# Patient Record
Sex: Male | Born: 1950
Health system: Southern US, Community
[De-identification: ages and names within clinical notes are randomized; demographics above are authoritative.]

## PROBLEM LIST (undated history)

## (undated) DIAGNOSIS — R0602 Shortness of breath: Secondary | ICD-10-CM

## (undated) DIAGNOSIS — G4730 Sleep apnea, unspecified: Secondary | ICD-10-CM

## (undated) DIAGNOSIS — I499 Cardiac arrhythmia, unspecified: Secondary | ICD-10-CM

## (undated) DIAGNOSIS — I4892 Unspecified atrial flutter: Secondary | ICD-10-CM

## (undated) DIAGNOSIS — I5021 Acute systolic (congestive) heart failure: Secondary | ICD-10-CM

## (undated) DIAGNOSIS — N529 Male erectile dysfunction, unspecified: Secondary | ICD-10-CM

## (undated) DIAGNOSIS — M1990 Unspecified osteoarthritis, unspecified site: Secondary | ICD-10-CM

## (undated) DIAGNOSIS — E785 Hyperlipidemia, unspecified: Secondary | ICD-10-CM

## (undated) DIAGNOSIS — I429 Cardiomyopathy, unspecified: Secondary | ICD-10-CM

## (undated) DIAGNOSIS — G4733 Obstructive sleep apnea (adult) (pediatric): Secondary | ICD-10-CM

## (undated) DIAGNOSIS — I5022 Chronic systolic (congestive) heart failure: Secondary | ICD-10-CM

## (undated) DIAGNOSIS — E669 Obesity, unspecified: Secondary | ICD-10-CM

## (undated) HISTORY — DX: Obstructive sleep apnea (adult) (pediatric): G47.33

## (undated) HISTORY — DX: Cardiomyopathy, unspecified: I42.9

## (undated) HISTORY — PX: APPENDECTOMY: SHX54

## (undated) HISTORY — DX: Male erectile dysfunction, unspecified: N52.9

## (undated) HISTORY — DX: Chronic systolic (congestive) heart failure: I50.22

## (undated) HISTORY — DX: Acute systolic (congestive) heart failure: I50.21

## (undated) HISTORY — DX: Obesity, unspecified: E66.9

## (undated) HISTORY — DX: Hyperlipidemia, unspecified: E78.5

## (undated) HISTORY — DX: Unspecified atrial flutter: I48.92

## (undated) HISTORY — DX: Unspecified osteoarthritis, unspecified site: M19.90

---

## 2003-10-26 ENCOUNTER — Emergency Department (HOSPITAL_COMMUNITY): Admission: EM | Admit: 2003-10-26 | Discharge: 2003-10-26 | Payer: Self-pay | Admitting: Emergency Medicine

## 2010-12-25 HISTORY — PX: TOTAL HIP ARTHROPLASTY: SHX124

## 2011-02-20 NOTE — H&P (Signed)
Jerome Kent, HERBST           ACCOUNT NO.:  [id number]  MEDICAL RECORD NO.:  [id number]           PATIENT TYPE:  I  LOCATION:  1S                           FACILITY:  [facility]  PHYSICIAN:  Madlyn Frankel. Charlann Boxer, M.D.  DATE OF BIRTH:  Nov 04, 1951  DATE OF ADMISSION: DATE OF DISCHARGE:                             HISTORY & PHYSICAL   ADMITTING DIAGNOSIS:  Right hip osteoarthritis.  BRIEF HISTORY:  Jerome Kent is a very pleasant 60 year old and otherwise healthy male who has had progressive problems and degenerative changes to his right hip.  Radiographs revealed advanced degenerative changes.  He had failed conservative measures and wished to proceed with hip arthroplasty.  Risks of infection, DVT, component failure, dislocation were all discussed, reviewed as well as an anterior versus posterior approach.  He is at this point scheduled for same-day surgery on February 28, 2011, for an anterior right total hip replacement.  PAST MEDICAL HISTORY: 1. Sleep apnea. 2. History of hypercholesterolemia. 3. He reports some mild coronary event in the 1980s with recent     cardiac catheterizations without evidence of any coronary artery     disease.  PAST SURGICAL HISTORY:  Abdominal surgery due to trauma and history of appendectomy.  CURRENT MEDICATIONS:  Generic Lipitor or atorvastatin 40 mg p.o. daily.  DRUG ALLERGIES:  None.  He denies latex, metal, or food allergies.  PRIMARY CARE PHYSICIAN:  Dario Guardian, M.D.  FAMILY MEDICAL HISTORY: 1. Diabetes. 2. Cancer. 3. Coronary artery disease.  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  He is married.  He has a few drinks a week, no significant alcohol or tobacco use.  REVIEW OF SYSTEMS:  Otherwise noted for musculoskeletal concerns with some fatigue due to his right hip pain.  He does have some shortness of breath with exertion with some wheezing.  He has at times some urination at night.  Otherwise, he has had no recent chest  pains, productive cough, or hemoptysis.  No recent rashes.  He has had no headaches, dizziness, blurred vision, or slurred speech.  He does use contacts.  He has had no problems with his balance.  He has had no nausea, vomiting, constipation, or diarrhea.  His appetite remains normal.  He has had no unilateral weakness or numbness or vascular status changes.  PHYSICAL EXAMINATION:  NEUROVASCULAR:  Examination at the time of his index evaluation revealed Trendelenburg gait to his right hip.  Hip range of motion was painful and limited.  He otherwise is neurovascularly intact in the right lower extremity. GENERAL:  Exam was otherwise unremarkable with no blurred vision or slurred speech. NECK:  He had normal cervical range of motion. CHEST:  Clear to auscultation bilaterally. HEART:  Regular rate and rhythm. ABDOMEN:  Slightly obese, but was nontender. VITAL SIGNS:  Obtained in the office were stable with him being afebrile with normal blood pressure.  CURRENT MEDICATIONS: 1. Atorvastatin 40 mg p.o. daily and otherwise, vitamins and minerals     which will be held during the time of an operation, but then will     be resumed postoperatively.  These include: 2. Alli 60  mg t.i.d. 3. Aspirin 81 mg p.o. daily. 4. Vitamin B12 250 mcg daily. 5. Vitamin B6 100 mg daily. 6. CoQ10 100 mg 4 times a week. 7. Vitamin E 400 units daily. 8. Folic acid. 9. Fish oil 600 mcg/137 mcg. 10.Grape Seed and Resveratrol. 11.Magnesium. 12.Mature multivitamin.  ASSESSMENT:  Right hip osteoarthritis.  PLAN:  The patient will be admitted for a same-day surgery on February 28, 2011, for right total hip replacement.  Postoperatively, his plan is to go home.  He will be placed on Xarelto for postoperative anticoagulation for a total of 12 days and then resume aspirin for 4 weeks and then his baby aspirin following this.  We are also going to utilize tranexamic acid and order provided to short stay for this.   He will receive this based on his kilogram weight at the time of induction.  The risks and benefits of this were discussed and reviewed with him.     Madlyn Frankel Charlann Boxer, M.D.     MDO/MEDQ  D:  02/19/2011  T:  02/19/2011  Job:  [id number]  cc:   Dario Guardian, M.D. Fax: [phone]  Electronically Signed by Durene Romans M.D. on 02/20/2011 06:59:08 AM

## 2011-02-21 ENCOUNTER — Other Ambulatory Visit: Payer: Self-pay | Admitting: Orthopedic Surgery

## 2011-02-21 ENCOUNTER — Ambulatory Visit (HOSPITAL_COMMUNITY)
Admission: RE | Admit: 2011-02-21 | Discharge: 2011-02-21 | Disposition: A | Discharge status: Home / Self Care | Payer: BC Managed Care – PPO | Source: Ambulatory Visit | Attending: Orthopedic Surgery | Admitting: Orthopedic Surgery

## 2011-02-21 ENCOUNTER — Other Ambulatory Visit (HOSPITAL_COMMUNITY): Payer: Self-pay | Admitting: Orthopedic Surgery

## 2011-02-21 ENCOUNTER — Encounter (HOSPITAL_COMMUNITY): Payer: BC Managed Care – PPO

## 2011-02-21 DIAGNOSIS — M169 Osteoarthritis of hip, unspecified: Secondary | ICD-10-CM | POA: Insufficient documentation

## 2011-02-21 DIAGNOSIS — Z01811 Encounter for preprocedural respiratory examination: Secondary | ICD-10-CM | POA: Insufficient documentation

## 2011-02-21 DIAGNOSIS — Z01818 Encounter for other preprocedural examination: Secondary | ICD-10-CM

## 2011-02-21 DIAGNOSIS — Z01810 Encounter for preprocedural cardiovascular examination: Secondary | ICD-10-CM | POA: Insufficient documentation

## 2011-02-21 DIAGNOSIS — Z01812 Encounter for preprocedural laboratory examination: Secondary | ICD-10-CM | POA: Insufficient documentation

## 2011-02-21 DIAGNOSIS — Z87891 Personal history of nicotine dependence: Secondary | ICD-10-CM | POA: Insufficient documentation

## 2011-02-21 DIAGNOSIS — M1610 Unilateral primary osteoarthritis, unspecified hip: Secondary | ICD-10-CM | POA: Insufficient documentation

## 2011-02-21 LAB — DIFFERENTIAL
Basophils Absolute: 0 10*3/uL (ref 0.0–0.1)
Basophils Relative: 0 % (ref 0–1)
Eosinophils Absolute: 0.1 10*3/uL (ref 0.0–0.7)
Eosinophils Relative: 1 % (ref 0–5)
Lymphocytes Relative: 32 % (ref 12–46)
Lymphs Abs: 2.1 10*3/uL (ref 0.7–4.0)
Monocytes Absolute: 0.7 10*3/uL (ref 0.1–1.0)
Monocytes Relative: 10 % (ref 3–12)
Neutro Abs: 3.6 10*3/uL (ref 1.7–7.7)
Neutrophils Relative %: 56 % (ref 43–77)

## 2011-02-21 LAB — BASIC METABOLIC PANEL
BUN: 12 mg/dL (ref 6–23)
CO2: 29 mEq/L (ref 19–32)
Calcium: 9 mg/dL (ref 8.4–10.5)
Chloride: 106 mEq/L (ref 96–112)
Creatinine, Ser: 0.78 mg/dL (ref 0.4–1.5)
GFR calc Af Amer: >60 mL/min (ref >60)
GFR calc non Af Amer: >60 mL/min (ref >60)
Glucose, Bld: 88 mg/dL (ref 70–99)
Potassium: 4.3 mEq/L (ref 3.5–5.1)
Sodium: 141 mEq/L (ref 135–145)

## 2011-02-21 LAB — CBC
HCT: 44.8 % (ref 39.0–52.0)
Hemoglobin: 14.3 g/dL (ref 13.0–17.0)
MCH: 30.7 pg (ref 26.0–34.0)
MCHC: 31.9 g/dL (ref 30.0–36.0)
MCV: 96.1 fL (ref 78.0–100.0)
Platelets: 194 10*3/uL (ref 150–400)
RBC: 4.66 MIL/uL (ref 4.22–5.81)
RDW: 13.6 % (ref 11.5–15.5)
WBC: 6.5 10*3/uL (ref 4.0–10.5)

## 2011-02-21 LAB — URINALYSIS, ROUTINE W REFLEX MICROSCOPIC
Bilirubin Urine: NEGATIVE
Hgb urine dipstick: NEGATIVE
Ketones, ur: NEGATIVE mg/dL
Nitrite: NEGATIVE
Protein, ur: NEGATIVE mg/dL
Specific Gravity, Urine: 1.019 (ref 1.005–1.030)
Urine Glucose, Fasting: NEGATIVE mg/dL
Urobilinogen, UA: 0.2 mg/dL (ref 0.0–1.0)
pH: 5 (ref 5.0–8.0)

## 2011-02-21 LAB — SURGICAL PCR SCREEN
MRSA, PCR: NEGATIVE
Staphylococcus aureus: POSITIVE — AB

## 2011-02-21 LAB — URINE MICROSCOPIC-ADD ON

## 2011-02-21 LAB — PROTIME-INR
INR: 0.93 (ref 0.00–1.49)
Prothrombin Time: 12.7 seconds (ref 11.6–15.2)

## 2011-02-21 LAB — APTT: aPTT: 26 seconds (ref 24–37)

## 2011-02-28 ENCOUNTER — Inpatient Hospital Stay (HOSPITAL_COMMUNITY): Payer: BC Managed Care – PPO

## 2011-02-28 ENCOUNTER — Inpatient Hospital Stay (HOSPITAL_COMMUNITY)
Admission: RE | Admit: 2011-02-28 | Discharge: 2011-03-03 | DRG: 818 | Disposition: A | Discharge status: Home Health | Payer: BC Managed Care – PPO | Source: Ambulatory Visit | Attending: Orthopedic Surgery | Admitting: Orthopedic Surgery

## 2011-02-28 DIAGNOSIS — G4733 Obstructive sleep apnea (adult) (pediatric): Secondary | ICD-10-CM | POA: Diagnosis present

## 2011-02-28 DIAGNOSIS — R110 Nausea: Secondary | ICD-10-CM | POA: Diagnosis not present

## 2011-02-28 DIAGNOSIS — M169 Osteoarthritis of hip, unspecified: Principal | ICD-10-CM | POA: Diagnosis present

## 2011-02-28 DIAGNOSIS — M1610 Unilateral primary osteoarthritis, unspecified hip: Principal | ICD-10-CM | POA: Diagnosis present

## 2011-02-28 DIAGNOSIS — M1990 Unspecified osteoarthritis, unspecified site: Secondary | ICD-10-CM

## 2011-02-28 LAB — TYPE AND SCREEN
ABO/RH(D): A POS
Antibody Screen: NEGATIVE

## 2011-02-28 LAB — ABO/RH: ABO/RH(D): A POS

## 2011-03-01 LAB — CBC
HCT: 36 % — ABNORMAL LOW (ref 39.0–52.0)
Hemoglobin: 11.8 g/dL — ABNORMAL LOW (ref 13.0–17.0)
MCH: 31.1 pg (ref 26.0–34.0)
MCHC: 32.8 g/dL (ref 30.0–36.0)
MCV: 95 fL (ref 78.0–100.0)
Platelets: 146 10*3/uL — ABNORMAL LOW (ref 150–400)
RBC: 3.79 MIL/uL — ABNORMAL LOW (ref 4.22–5.81)
RDW: 13.8 % (ref 11.5–15.5)
WBC: 7.2 10*3/uL (ref 4.0–10.5)

## 2011-03-01 LAB — BASIC METABOLIC PANEL
BUN: 11 mg/dL (ref 6–23)
CO2: 27 mEq/L (ref 19–32)
Calcium: 8 mg/dL — ABNORMAL LOW (ref 8.4–10.5)
Chloride: 106 mEq/L (ref 96–112)
Creatinine, Ser: 0.73 mg/dL (ref 0.4–1.5)
GFR calc Af Amer: >60 mL/min (ref >60)
GFR calc non Af Amer: >60 mL/min (ref >60)
Glucose, Bld: 119 mg/dL — ABNORMAL HIGH (ref 70–99)
Potassium: 3.8 mEq/L (ref 3.5–5.1)
Sodium: 138 mEq/L (ref 135–145)

## 2011-03-02 LAB — CBC
HCT: 33.8 % — ABNORMAL LOW (ref 39.0–52.0)
Hemoglobin: 10.9 g/dL — ABNORMAL LOW (ref 13.0–17.0)
MCH: 30.6 pg (ref 26.0–34.0)
MCHC: 32.2 g/dL (ref 30.0–36.0)
MCV: 94.9 fL (ref 78.0–100.0)
Platelets: 123 10*3/uL — ABNORMAL LOW (ref 150–400)
RBC: 3.56 MIL/uL — ABNORMAL LOW (ref 4.22–5.81)
RDW: 13.4 % (ref 11.5–15.5)
WBC: 4.6 10*3/uL (ref 4.0–10.5)

## 2011-03-02 LAB — BASIC METABOLIC PANEL
BUN: 9 mg/dL (ref 6–23)
CO2: 29 mEq/L (ref 19–32)
Calcium: 7.6 mg/dL — ABNORMAL LOW (ref 8.4–10.5)
Chloride: 103 mEq/L (ref 96–112)
Creatinine, Ser: 0.69 mg/dL (ref 0.4–1.5)
GFR calc Af Amer: >60 mL/min (ref >60)
GFR calc non Af Amer: >60 mL/min (ref >60)
Glucose, Bld: 102 mg/dL — ABNORMAL HIGH (ref 70–99)
Potassium: 3.9 mEq/L (ref 3.5–5.1)
Sodium: 136 mEq/L (ref 135–145)

## 2011-03-03 LAB — CBC
HCT: 32.9 % — ABNORMAL LOW (ref 39.0–52.0)
Hemoglobin: 10.8 g/dL — ABNORMAL LOW (ref 13.0–17.0)
MCH: 30.9 pg (ref 26.0–34.0)
MCHC: 32.8 g/dL (ref 30.0–36.0)
MCV: 94.3 fL (ref 78.0–100.0)
Platelets: 130 10*3/uL — ABNORMAL LOW (ref 150–400)
RBC: 3.49 MIL/uL — ABNORMAL LOW (ref 4.22–5.81)
RDW: 13.3 % (ref 11.5–15.5)
WBC: 4.8 10*3/uL (ref 4.0–10.5)

## 2011-03-03 NOTE — Op Note (Signed)
Jerome Kent, Jerome Kent           ACCOUNT NO.:  [id number]  MEDICAL RECORD NO.:  [id number]           PATIENT TYPE:  I  LOCATION:  0006                         FACILITY:  [facility]  PHYSICIAN:  Jerome Kent, M.D.  DATE OF BIRTH:  04/16/1951  DATE OF PROCEDURE: DATE OF DISCHARGE:                              OPERATIVE REPORT   PREOPERATIVE DIAGNOSIS:  Right hip osteoarthritis.  POSTOPERATIVE DIAGNOSIS:  Right hip osteoarthritis.  PROCEDURE:  Right total hip replacement through an anterior approach.  COMPONENTS UTILIZED:  DePuy hip system with size 56 pinnacle cup, 36+4 neutral liner, size 6 high trial lock stem with a 36 Delta ceramic ball +1.5.  SURGEON:  Jerome Kent, M.D.  ASSISTANT:  Jerome Kent, P.A.  ANESTHESIA:  General.  SPECIMENS:  None.  COMPLICATIONS:  None.  DRAINS:  1 Hemovac.  BLOOD LOSS:  Approximately 800 mL.  INDICATIONS FOR PROCEDURE:  Jerome Kent is a 60 year old gentleman who presented to the office for evaluation of right hip pain.  Radiographs revealed end-stage degenerative changes.  After reviewing the condition of his hip and significant reduction of his quality life, he at this point was ready to proceed with more definitive measures.  Risks of infection, DVT, component failure, need for revision surgery, dislocation were all discussed and reviewed, as were the risks and benefits, pros and cons of anterior versus posterior approach.  Most of which at this point appear radical.  He chose to have a right anterior hip replacement.  Consent was obtained for the benefit of the pain relief.  PROCEDURE IN DETAIL:  The patient was brought to operative theater. Once adequate anesthesia, preoperative antibiotics, Ancef administered, the patient was positioned supine on the OSI Hana table.  Once positioned, bony prominences were padded.  His fluoroscopy was brought to the field to identify and evaluate landmarks for  preoperative positioning.  Following this, the right hip was prepped and draped in sterile fashion allowing for exposure of the anterior iliac spine.  Time-out was performed, identifying the patient, planned procedure in the extremity.  At this point, incision was made 2 to 3 cm lateral to the anterior- superior iliac spine, extending over the anterior trochanter of the fascia.  The tensor fascial lata muscle was exposed and soft-tissue planes created and the protractor placed.  The fascia was then incised and the muscle belly elevated off the undersurface of the fascia.  It was swept laterally and superior neck identified and retractor placed.  Following cauterization of the anterior circumflex vessels and removal of precapsular fat, anterior retractor was placed.  I elevated the anterior portion of the rectus on rim of acetabulum, placed a retractor anteriorly and subsequently made an L capsulotomy based on the superior neck to the trochanteric fossa and then extending to the intertrochanteric line.  Tag sutures were placed and the retractor was placed intra-articularly.  At this point, I used an osteotome to remove osteophytes off the anterior aspect of the head.  Traction was then applied. Fluoroscopically, I evaluated the neck cut position.  Once I had this in satisfactory position, the neck cut was made and the femoral head removed.  Following this, traction was relaxed off the femur and attention was now directed to acetabulum.  An anterior retractor was placed as well as one posterior labrum and foveal tissue debrided, identifying the central fovea area, began reaming with a 47 reamer, noted significant posterior wear off his acetabulum.  I reamed up to 55 reamer, confirming the location of my reaming in terms of depth and position radiographically.  56-cup was chosen and it was then impacted under fluoroscopic imaging, confirming an appropriate amount of abduction and  forward flexion correlating with the anatomic landmarks. Placed a single cancellous screw and a hole eliminator and then a final 36+4 neutral liner in all tracts.  At this point, attention was now directed to the femur.  The femur was externally rotated and was able to release further inferior capsule and Mueller retractors were placed medial as the hip was rotated to 120 degrees.  The lateral hook was used to elevate the femur off.  At this point, Hohmann retractor was placed, posterior-superior and allowing partial release of the posterior structures.  I then placed a larger retractor to close the medius and gluteus musculature back.  The hip without traction was then dropped and then was extended and adducted, allowing exposure of proximal femur.  Using a box osteotome, I created the orientation of the femoral positioning.  The initial introductory broach was then placed and then fluoroscopic imaging obtained at 0 and 90 degrees to confirm position.  Following this, I replaced all the retractors and began broaching and broached up to a size 6 and with that at the level of my neck cut, I did decide based on initial evaluation perhaps I need to have my broach a little bit higher, so I broached up to a size 7, which did sit about 3 to 4 mm trial of the neck cut.  At this point, I did a trial reduction with a high offset neck based off his preoperative evaluation.  On radiographic assessment, I felt that there may have been some extra length on this right side. For this reason, I chose a 6 high trial lock stem.  The hip was dislocated.  Trial components removed.  Final irrigation was carried out.  The final 6 high trial lock stem was then impacted and then sat at the level of my neck cut.  I did retrial the 36+1.5 ball.  With the 1.5 ball impacted, I felt that the hip was stable.  I was able to get 1 mm or 2 mm of shuck in extension with gentle pressure and the hip felt stable  throughout.  Range of motion, for this reason, I chose 36+1.5 ball and this was impacted on to clean and dry trunnion and the hip appeared to be reduced.  Hip had been irrigated throughout the case at this point.  I reapproximated the anterior capsular tissues using #1 Vicryl.  I then placed a medium Hemovac drain deep.  The fascia of the tensor fascia lata was then reapproximated using #1 Vicryl.  The remaining wound was closed with 2-0 Vicryl and running 4-0 Monocryl. The hip was then cleaned, dried and dressed sterilely using Dermabond and Aquacel dressing, drain site separately.  He was extubated and brought to recovery room in stable condition, tolerating the procedure well.     Jerome Frankel Charlann Kent, M.D.     MDO/MEDQ  D:  02/28/2011  T:  02/28/2011  Job:  [id number]  Electronically Signed by Durene Romans M.D. on 03/03/2011 07:04:08 AM

## 2011-03-09 NOTE — Discharge Summary (Signed)
Jerome Kent, Jerome Kent           ACCOUNT NO.:  [id number]  MEDICAL RECORD NO.:  [id number]           PATIENT TYPE:  I  LOCATION:  1602                         FACILITY:  [facility]  PHYSICIAN:  Madlyn Frankel. Charlann Boxer, M.D.  DATE OF BIRTH:  1951-05-05  DATE OF ADMISSION:  02/28/2011 DATE OF DISCHARGE:  03/03/2011                              DISCHARGE SUMMARY   ADMISSION DIAGNOSIS:  Right hip osteoarthritis.  POSTOPERATIVE DIAGNOSES: 1. Right hip osteoarthritis, status post right total hip replacement,     February 28, 2011. 2. History of hypercholesterolemia. 3. Sleep apnea. 4. Postoperative nausea.  ADMITTING HISTORY:  Mr. Guardia is a very pleasant 60 year old male who presented to the office for evaluation of right hip pain. Radiographs reveals bone-on-bone changes.  Given his persistent discomfort, he wished to proceed with hip arthroplasty.  We reviewed the risks and benefits and pros and cons of total hip replacement including the anterior approach and posterior approach, and he wished to proceed with an anterior approach.  Following obtained consent, he was admitted to hospital for this.  HOSPITAL COURSE:  The patient was admitted for same-day surgery on 02/28/2011 and he underwent an uncomplicated anterior approach to right total hip replacement.  He did well and taken to the recovery room for routine stay and was transferred to the orthopedic ward.  His postoperative course, in first 2 days was noted by a bit of postoperative nausea.  We adjusted his pain medication down to the use of tramadol to help with pain and decreased sense of narcotic based nausea.  Otherwise, he was seen and evaluated by Physical Therapy.  His Hemovac and Foley removed on postop day #1.  His labs in the hospital revealed a postoperative hematocrit of 36 on day #1.  On day #2, it went down to 33.9.  By postop day #3, repeat lab indicated a hematocrit of 32.9 indicating stability.  His  electrolytes remained stable.  His right hip wound was stable throughout his hospital stay.  By postop day #3, he was ready to go home.  DISCHARGE INSTRUCTIONS:  The patient was discharged to home with home health physical therapy to be weightbearing as tolerated, work on gait training, strengthening, and transition from a walker to cane.  He is to return to see Dr. Durene Romans at [facility] in 2 weeks' time and appointment has already been set.  He is on a regular diet.  DISCHARGE MEDICATIONS:  Colace 100 mg p.o. b.i.d. as needed for constipation while on pain medicine, MiraLax 17 g p.o. daily as needed for constipation while on pain medicine, Robaxin 500 mg q.6h. as needed for pain medicine, iron sulfate 325 mg b.i.d. for 2 weeks, Xarelto 10 mg p.o. daily for 10 days, following that he will take aspirin 325 mg for 30 days.  __________ mg t.i.d., atorvastatin 40 mg daily.  Co-Q-10 100 mg 4 times a week, folic acid/fish oil/DHEA daily.  Grape seed oil, __________ 150/15 p.o. daily, magnesium p.o. daily, multivitamin daily.  He was discharged on tramadol 50 mg tablets, 1-2 tablets every 4-6 hours as needed for pain, and the orthopedic questions can be addressed to  [facility].     Madlyn Frankel Charlann Boxer, M.D.     MDO/MEDQ  D:  03/03/2011  T:  03/03/2011  Job:  [id number]  Electronically Signed by Durene Romans M.D. on 03/09/2011 12:10:56 PM

## 2012-01-26 ENCOUNTER — Other Ambulatory Visit: Payer: Self-pay | Admitting: Family Medicine

## 2012-01-26 DIAGNOSIS — K111 Hypertrophy of salivary gland: Secondary | ICD-10-CM

## 2012-01-30 ENCOUNTER — Ambulatory Visit
Admission: RE | Admit: 2012-01-30 | Discharge: 2012-01-30 | Disposition: A | Discharge status: Home / Self Care | Payer: BC Managed Care – PPO | Source: Ambulatory Visit | Attending: Family Medicine | Admitting: Family Medicine

## 2012-01-30 DIAGNOSIS — K111 Hypertrophy of salivary gland: Secondary | ICD-10-CM

## 2012-01-30 MED ORDER — IOHEXOL 300 MG/ML  SOLN
75.0000 mL | Freq: Once | INTRAMUSCULAR | Status: AC | PRN
  Administered 2012-01-30: 75 mL via INTRAVENOUS

## 2013-09-12 ENCOUNTER — Other Ambulatory Visit: Payer: Self-pay | Admitting: Cardiology

## 2013-09-13 NOTE — H&P (Signed)
  Patient: Jerome Kent, Jerome Kent Account Number: [id number] Provider: Latiqua Daloia, MD  DOB: 01/02/1951 Age: 62 Y Sex: Male Date: 09/11/2013  Phone: [phone]   Address: [address], [place], [place]  Pcp: CANDACE SMITH          1. Consultation at the request of Dr. Candaice Smith for the evaluation of new onset atrial flutter.        HPI:  General:  62 year old with OSA on CPAP (complaince), hyperlipidemia, ED with increased dyspnea over the past six months, mild to moderate in severity, worse with activity such as push mowing with no associtated chest pain, dizziness, syncope, CVA like symptoms. Quit smoking years ago. Has a history of rheumatic fever. Feels tired all of the time. No DM, no CVA. In morning feels worse. Getting on shoes. Has some orthopnea as well at times. Would rather sleep in a chair.   ECG, personally viewed, demonstrated typical atrial flutter appearance (negative in inferior leads) with mostly 4:1 conduction. Never had before. .        ROS:  No thyroid issues. No bleeding, rashes, hair or skin changes. No fevers, pain, excessive ETOH use. Unless above, all other ROS negative.       Medical History: Erectile dysfunction, Hyperlipidemia, chronic sleep disorder CPAP (sees Dr. Osborne), Rheumatic fever, rt hip osteoarthritis, followed at [facility], Dr. Olin, replaced 01/2011.        Surgical History: appendectomy in childhood , arthroscopic knee surgery, right 01/2011 , right hip replacement 01/2011 Dr. Olin , right knee arthroscopic, 02/2011 Dr. Olin .        Hospitalization/Major Diagnostic Procedure: surgery , hip replacement .        Family History: Father: deceased 67 yrs, diabetes, stroke, cancer (mediterranean cancer), diagnosed with DMMother: deceased 57 yrs, heart disease, diagnosed with CADPaternal Grand Father: deceased, unknown hxPaternal Grand Mother: deceased, unknown hxMaternal Grand Father: deceased, unknown hxMaternal Grand Mother:  deceased, unknown hxBrother 1: alive 80 yrsBrother2: deceased 40 yrs, GSW ? suicideBrother 3: alive, DM IISister 1: alive, healthy4 brother(s) , 1 sister(s) . 2 son(s) , 3 daughter(s) - healthy.  4th brother alive and healthy , youngest brother with DM.       Social History:  General:  Tobacco use  cigarettes: Former smoker Quit in year 1997 Pack-year Hx: 12 Tobacco history last updated 09/10/2013 Smoking: no, quit 1997.  Tobacco Exposure: exposed as a child due to mom's smoking.  Alcohol: beer, wine, 2+ per week.  Caffeine: 2+ servings daily.  Recreational drug use: no.  Diet: low carb.  Exercise: limited by job hours.  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  Education: Some College.  Marital Status: married, 1997, Lorna, relationship is good.  Children: 2 boys and 3 girls, living children, 10 granchildren.  Religion: [employer].  Firearms: no.  Seat belt use: always.        Medications: Taking Aspirin 81 MG Tablet Delayed Release 1 tablet Once a day, Taking Lipitor 40 MG Tablet one tablet once a day, Taking Xarelto 20 mg . tablet 1 tablet once a day, Not-Taking/PRN Viagra 100 MG Tablet 1 tablet as needed as needed, Medication List reviewed and reconciled with the patient       Allergies: N.K.D.A.          Vitals: Wt 300.8, Wt change -3.6 lb, Ht 67.75, BMI 46.07, Pulse sitting 72, BP sitting 108/80.       Examination:  General Examination:  GENERAL   APPEARANCE alert, oriented, NAD, pleasant.  SKIN: normal, no rash.  HEENT: normal.  HEAD: [place]/AT.  EYES: EOMI, Conjunctiva clear.  NECK: supple, FROM, without evidence of thyromegaly, adenopathy, or bruits, no jugular venous distention (JVD).  LUNGS: clear to auscultation bilaterally, no wheezes, rhonchi, rales, regular breathing rate and effort.  HEART: irreg irreg normal rate with brief (5-10 second) episode of rapid HR (likely 140-150 range, probably 2:1 conduction). no S3, S4, murmur  or rub, point of maximul impulse (PMI) normal.  ABDOMEN: soft, non-tender/non-distended, bowel sounds present, no masses palpated, no bruit, obese.  EXTREMITIES: no clubbing, no edema, pulses 2 plus bilaterally.  NEUROLOGIC EXAM: non-focal exam, alert and oriented x 3.  PERIPHERAL PULSES: normal (2+) bilaterally.  LYMPH NODES: no cervical adenopathy.  PSYCH affect normal.  Prior medical records, ECG, labs reviewed.           Assessment:  1. Atrial flutter - 427.32 (Primary)  2. Hypercholesteremia, pure - 272.0, pt will continue on the lipitor once a day.  3. Anticoagulant monitoring - V58.61  4. Obesity - 278.00  5. Obstructive sleep apnea (adult) (pediatric) - 327.23, Migrated        1. Atrial flutter  LAB: PT (Prothrombin Time) (005199) LAB: Basic Metabolic Notes: Given evidence of both 4-1 conduction on prior EKG as well as more rapid conduction on auscultation today paroxysmal. Sleep apnea, obesity playing a role. Prior history of rheumatic heart disease possibly playing a role as well. Because of paroxysmal tachycardia, I would like to proceed with transesophageal echocardiogram with cardioversion on Monday. Continue taking anticoagulation Xarelto started 09/10/13. Risks and benefits of procedure have been explained. Questioning. Wife present. Discuss the possibility of tachycardia-induced cardiomyopathy. He does not have persistent tachycardia however. Certainly his symptoms may be secondary to atrial arrhythmia.       2. Hypercholesteremia, pure  Notes: Diet, exercise.       3. Anticoagulant monitoring  Notes: Lab work reviewed.       4. Obesity  Notes: Weight loss helpful in this situation. Encourage.       5. Obstructive sleep apnea (adult) (pediatric)  Notes: Encourage CPAP treatment/compliance.        Procedures:  Venipuncture:  Venipuncture: Smith,Michele 09/11/2013 11:36:47 AM > , performed in right arm.           Labs:    Lab: PT (Prothrombin Time)  (005199)  Prothrombin Time 12.7 H 9.1-12.0 - SEC  INR 1.2  0.8-1.2 -    Zaydee Aina 09/12/2013 07:05:46 AM >         Lab: Basic Metabolic Creat 0.87  GLUCOSE 87  70-99 - mg/dL  BUN 18  6-26 - mg/dL  CREATININE 0.87  0.60-1.30 - mg/dl  eGFR (NON-AFRICAN AMERICAN) 89  >60 - calc  eGFR (AFRICAN AMERICAN) 107  >60 - calc  SODIUM 141  136-145 - mmol/Kent  POTASSIUM 4.7  3.5-5.5 - mmol/Kent  CHLORIDE 107  98-107 - mmol/Kent  C02 30  22-32 - mmol/Kent  ANION GAP 8.4  6.0-20.0 - mmol/Kent  CALCIUM 9.8  8.6-10.3 - mg/dL   Renwick Asman 09/11/2013 03:54:34 PM > ok for cardioversion          Procedure Codes: 80048 ECL BMP, 36415 BLOOD COLLECTION ROUTINE VENIPUNCTURE       Preventive:   CC: Dr. Candace Smith.       Follow Up: post cardioversion          

## 2013-09-15 ENCOUNTER — Ambulatory Visit (HOSPITAL_COMMUNITY)
Admission: RE | Admit: 2013-09-15 | Discharge: 2013-09-15 | Disposition: A | Discharge status: Home / Self Care | Payer: BC Managed Care – PPO | Source: Ambulatory Visit | Attending: Cardiology | Admitting: Cardiology

## 2013-09-15 ENCOUNTER — Encounter (HOSPITAL_COMMUNITY)
Admission: RE | Disposition: A | Discharge status: Home / Self Care | Payer: Self-pay | Source: Ambulatory Visit | Attending: Cardiology

## 2013-09-15 ENCOUNTER — Encounter (HOSPITAL_COMMUNITY): Payer: Self-pay | Admitting: Anesthesiology

## 2013-09-15 ENCOUNTER — Encounter (HOSPITAL_COMMUNITY): Payer: Self-pay | Admitting: *Deleted

## 2013-09-15 ENCOUNTER — Ambulatory Visit (HOSPITAL_COMMUNITY): Payer: BC Managed Care – PPO | Admitting: Anesthesiology

## 2013-09-15 DIAGNOSIS — E7800 Pure hypercholesterolemia, unspecified: Secondary | ICD-10-CM | POA: Insufficient documentation

## 2013-09-15 DIAGNOSIS — E669 Obesity, unspecified: Secondary | ICD-10-CM | POA: Insufficient documentation

## 2013-09-15 DIAGNOSIS — Z7901 Long term (current) use of anticoagulants: Secondary | ICD-10-CM | POA: Insufficient documentation

## 2013-09-15 DIAGNOSIS — G4733 Obstructive sleep apnea (adult) (pediatric): Secondary | ICD-10-CM | POA: Insufficient documentation

## 2013-09-15 DIAGNOSIS — I4892 Unspecified atrial flutter: Secondary | ICD-10-CM | POA: Diagnosis present

## 2013-09-15 HISTORY — DX: Cardiac arrhythmia, unspecified: I49.9

## 2013-09-15 HISTORY — DX: Shortness of breath: R06.02

## 2013-09-15 HISTORY — PX: TEE WITHOUT CARDIOVERSION: SHX5443

## 2013-09-15 HISTORY — DX: Sleep apnea, unspecified: G47.30

## 2013-09-15 HISTORY — PX: CARDIOVERSION: SHX1299

## 2013-09-15 SURGERY — ECHOCARDIOGRAM, TRANSESOPHAGEAL
Anesthesia: General

## 2013-09-15 MED ORDER — FUROSEMIDE 20 MG PO TABS: 20.0000 mg | ORAL_TABLET | Freq: Every day | ORAL | Status: DC

## 2013-09-15 MED ORDER — METOPROLOL SUCCINATE ER 25 MG PO TB24: 25.0000 mg | ORAL_TABLET | Freq: Every day | ORAL | Status: DC

## 2013-09-15 MED ORDER — DIPHENHYDRAMINE HCL 50 MG/ML IJ SOLN
INTRAMUSCULAR | Status: AC
  Filled 2013-09-15: qty 1

## 2013-09-15 MED ORDER — ETOMIDATE 2 MG/ML IV SOLN
INTRAVENOUS | Status: DC | PRN
  Administered 2013-09-15: 20 mg via INTRAVENOUS

## 2013-09-15 MED ORDER — LIDOCAINE HCL (CARDIAC) 20 MG/ML IV SOLN
INTRAVENOUS | Status: DC | PRN
  Administered 2013-09-15: 30 mg via INTRAVENOUS

## 2013-09-15 MED ORDER — MIDAZOLAM HCL 5 MG/ML IJ SOLN
INTRAMUSCULAR | Status: AC
  Filled 2013-09-15: qty 2

## 2013-09-15 MED ORDER — MIDAZOLAM HCL 10 MG/2ML IJ SOLN
INTRAMUSCULAR | Status: DC | PRN
  Administered 2013-09-15: 2 mg via INTRAVENOUS

## 2013-09-15 MED ORDER — BUTAMBEN-TETRACAINE-BENZOCAINE 2-2-14 % EX AERO
INHALATION_SPRAY | CUTANEOUS | Status: DC | PRN
  Administered 2013-09-15: 2 via TOPICAL

## 2013-09-15 MED ORDER — FENTANYL CITRATE 0.05 MG/ML IJ SOLN
INTRAMUSCULAR | Status: DC | PRN
  Administered 2013-09-15: 25 ug via INTRAVENOUS

## 2013-09-15 MED ORDER — FENTANYL CITRATE 0.05 MG/ML IJ SOLN
INTRAMUSCULAR | Status: AC
  Filled 2013-09-15: qty 2

## 2013-09-15 MED ORDER — SODIUM CHLORIDE 0.9 % IV SOLN
INTRAVENOUS | Status: DC | PRN
  Administered 2013-09-15: 10:00:00 via INTRAVENOUS

## 2013-09-15 MED ORDER — SODIUM CHLORIDE 0.9 % IV SOLN
INTRAVENOUS | Status: DC
  Administered 2013-09-15: 09:00:00 via INTRAVENOUS

## 2013-09-15 NOTE — Anesthesia Preprocedure Evaluation (Addendum)
Anesthesia Evaluation  Patient identified by MRN, date of birth, ID band Patient awake    Reviewed: Allergy & Precautions, H&P , NPO status , Patient's Chart, lab work & pertinent test results, reviewed documented beta blocker date and time   Airway Mallampati: II TM Distance: >3 FB     Dental  (+) Teeth Intact, Missing and Dental Advisory Given   Pulmonary shortness of breath and with exertion, sleep apnea and Continuous Positive Airway Pressure Ventilation ,  breath sounds clear to auscultation  + decreased breath sounds      Cardiovascular + dysrhythmias Atrial Fibrillation Rhythm:Irregular Rate:Normal     Neuro/Psych    GI/Hepatic negative GI ROS, Neg liver ROS,   Endo/Other  negative endocrine ROSMorbid obesity  Renal/GU negative Renal ROS     Musculoskeletal negative musculoskeletal ROS (+)   Abdominal (+) + obese,  Abdomen: soft. Bowel sounds: normal.  Peds  Hematology negative hematology ROS (+)   Anesthesia Other Findings Labs pending.  Normal K and Cr.  EF 25% per Dr. Anne Fu.  Carlynn Herald, CRNA  Reproductive/Obstetrics negative OB ROS                      Anesthesia Physical Anesthesia Plan  ASA: III  Anesthesia Plan: General   Post-op Pain Management:    Induction: Intravenous  Airway Management Planned: Mask  Additional Equipment:   Intra-op Plan:   Post-operative Plan:   Informed Consent: I have reviewed the patients History and Physical, chart, labs and discussed the procedure including the risks, benefits and alternatives for the proposed anesthesia with the patient or authorized representative who has indicated his/her understanding and acceptance.     Plan Discussed with: CRNA and Surgeon  Anesthesia Plan Comments:         Anesthesia Quick Evaluation

## 2013-09-15 NOTE — CV Procedure (Signed)
Electrical Cardioversion Procedure Note Jaevion Goto [id number] [birth year]  Procedure: Electrical Cardioversion Indications:  Atrial Flutter  Time Out: Verified patient identification, verified procedure,medications/allergies/relevent history reviewed, required imaging and test results available.  Performed  Procedure Details  The patient was NPO after midnight. Anesthesia was administered at the beside  by Dr.Joslin with etomadate.  Cardioversion was performed with synchronized biphasic defibrillation via AP pads with 150 joules.  1 attempt(s) were performed.  The patient converted to normal sinus rhythm. The patient tolerated the procedure well   IMPRESSION:  Successful cardioversion of atrial flutter. EF 20% Mild to mod MR  Flutter was typical.  Hopefully EF 20% will improve with restoration of NSR.  He is at high risk for development again of flutter. If this happens, EP, ?ablation.  Will continue Xarelto.  I will add Metoprolol ER 25mg  PO QD I will hopefully add lisinopil as outpt.     Toi Stelly 09/15/2013, 11:08 AM

## 2013-09-15 NOTE — Transfer of Care (Signed)
Immediate Anesthesia Transfer of Care Note  Patient: Jerome Kent  Procedure(s) Performed: Procedure(s): TRANSESOPHAGEAL ECHOCARDIOGRAM (TEE) (N/A) CARDIOVERSION (N/A)  Patient Location: PACU  Anesthesia Type:General  Level of Consciousness: awake, alert  and oriented  Airway & Oxygen Therapy: Patient connected to nasal cannula oxygen  Post-op Assessment: Report given to PACU RN  Post vital signs: stable  Complications: No apparent anesthesia complications

## 2013-09-15 NOTE — Preoperative (Signed)
Beta Blockers   Reason not to administer Beta Blockers:Not Applicable 

## 2013-09-15 NOTE — Progress Notes (Signed)
*  Echocardiogram Echocardiogram Transesophageal has been performed.  Dorothey Baseman 09/15/2013, 11:47 AM

## 2013-09-15 NOTE — Anesthesia Postprocedure Evaluation (Signed)
  Anesthesia Post-op Note  Patient: Jerome Kent  Procedure(s) Performed: Procedure(s): TRANSESOPHAGEAL ECHOCARDIOGRAM (TEE) (N/A) CARDIOVERSION (N/A)  Patient Location: Endoscopy Unit  Anesthesia Type:General  Level of Consciousness: awake, alert  and oriented  Airway and Oxygen Therapy: Patient Spontanous Breathing and Patient connected to nasal cannula oxygen  Post-op Pain: none  Post-op Assessment: Post-op Vital signs reviewed, Patient's Cardiovascular Status Stable, Respiratory Function Stable, Patent Airway and No signs of Nausea or vomiting  Post-op Vital Signs: stable  Complications: No apparent anesthesia complications

## 2013-09-15 NOTE — Anesthesia Postprocedure Evaluation (Signed)
  Anesthesia Post-op Note  Patient: Jerome Kent  Procedure(s) Performed: Procedure(s): TRANSESOPHAGEAL ECHOCARDIOGRAM (TEE) (N/A) CARDIOVERSION (N/A)  Patient Location: PACU  Anesthesia Type:General  Level of Consciousness: awake, alert  and oriented  Airway and Oxygen Therapy: Patient connected to nasal cannula oxygen  Post-op Pain: none  Post-op Assessment: Patient's Cardiovascular Status Stable  Post-op Vital Signs: stable  Complications: No apparent anesthesia complications

## 2013-09-15 NOTE — H&P (View-Only) (Signed)
Patient: Jerome Kent, Jerome Kent Account Number: [id number] Provider: [name], MD  DOB: 05-27-1951 Age: 62 Y Sex: Male Date: 09/11/2013  Phone: [phone]   Address: [address], [place], [place]  Pcp: CANDACE SMITH          1. Consultation at the request of Dr. Milana Obey for the evaluation of new onset atrial flutter.        HPI:  General:  62 year old with OSA on CPAP (complaince), hyperlipidemia, ED with increased dyspnea over the past six months, mild to moderate in severity, worse with activity such as push mowing with no associtated chest pain, dizziness, syncope, CVA like symptoms. Quit smoking years ago. Has a history of rheumatic fever. Feels tired all of the time. No DM, no CVA. In morning feels worse. Getting on shoes. Has some orthopnea as well at times. Would rather sleep in a chair.   ECG, personally viewed, demonstrated typical atrial flutter appearance (negative in inferior leads) with mostly 4:1 conduction. Never had before. .        ROS:  No thyroid issues. No bleeding, rashes, hair or skin changes. No fevers, pain, excessive ETOH use. Unless above, all other ROS negative.       Medical History: Erectile dysfunction, Hyperlipidemia, chronic sleep disorder CPAP (sees Dr. Earl Gala), Rheumatic fever, rt hip osteoarthritis, followed at [facility] orthopedics, Dr. Charlann Boxer, replaced 01/2011.        Surgical History: appendectomy in childhood , arthroscopic knee surgery, right 01/2011 , right hip replacement 01/2011 Dr. Charlann Boxer , right knee arthroscopic, 02/2011 Dr. Charlann Boxer .        Hospitalization/Major Diagnostic Procedure: surgery , hip replacement .        Family History: Father: deceased 72 yrs, diabetes, stroke, cancer (mediterranean cancer), diagnosed with DMMother: deceased 38 yrs, heart disease, diagnosed with CADPaternal Grand Father: deceased, unknown hxPaternal Grand Mother: deceased, unknown hxMaternal Grand Father: deceased, unknown hxMaternal Grand Mother:  deceased, unknown hxBrother 1: alive 54 yrsBrother2: deceased 40 yrs, GSW ? suicideBrother 3: alive, DM IISister 1: alive, healthy4 brother(s) , 1 sister(s) . 2 son(s) , 3 daughter(s) - healthy.  4th brother alive and healthy , youngest brother with DM.       Social History:  General:  Tobacco use  cigarettes: Former smoker Quit in year 1997 Pack-year Hx: 12 Tobacco history last updated 09/10/2013 Smoking: no, quit 1997.  Tobacco Exposure: exposed as a child due to mom's smoking.  Alcohol: beer, wine, 2+ per week.  Caffeine: 2+ servings daily.  Recreational drug use: no.  Diet: low carb.  Exercise: limited by job hours.  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  Education: Teaching laboratory technician.  Marital Status: married, 1997, Minna Antis, relationship is good.  Children: 2 boys and 3 girls, living children, 10 granchildren.  Religion: [employer].  Firearms: no.  Seat belt use: always.        Medications: Taking Aspirin 81 MG Tablet Delayed Release 1 tablet Once a day, Taking Lipitor 40 MG Tablet one tablet once a day, Taking Xarelto 20 mg . tablet 1 tablet once a day, Not-Taking/PRN Viagra 100 MG Tablet 1 tablet as needed as needed, Medication List reviewed and reconciled with the patient       Allergies: N.K.D.A.          Vitals: Wt 300.8, Wt change -3.6 lb, Ht 67.75, BMI 46.07, Pulse sitting 72, BP sitting 108/80.       Examination:  General Examination:  GENERAL  APPEARANCE alert, oriented, NAD, pleasant.  SKIN: normal, no rash.  HEENT: normal.  HEAD: Kennedy/AT.  EYES: EOMI, Conjunctiva clear.  NECK: supple, FROM, without evidence of thyromegaly, adenopathy, or bruits, no jugular venous distention (JVD).  LUNGS: clear to auscultation bilaterally, no wheezes, rhonchi, rales, regular breathing rate and effort.  HEART: irreg irreg normal rate with brief (5-10 second) episode of rapid HR (likely 140-150 range, probably 2:1 conduction). no S3, S4, murmur  or rub, point of maximul impulse (PMI) normal.  ABDOMEN: soft, non-tender/non-distended, bowel sounds present, no masses palpated, no bruit, obese.  EXTREMITIES: no clubbing, no edema, pulses 2 plus bilaterally.  NEUROLOGIC EXAM: non-focal exam, alert and oriented x 3.  PERIPHERAL PULSES: normal (2+) bilaterally.  LYMPH NODES: no cervical adenopathy.  PSYCH affect normal.  Prior medical records, ECG, labs reviewed.           Assessment:  1. Atrial flutter - 427.32 (Primary)  2. Hypercholesteremia, pure - 272.0, pt will continue on the lipitor once a day.  3. Anticoagulant monitoring - V58.61  4. Obesity - 278.00  5. Obstructive sleep apnea (adult) (pediatric) - 327.23, Migrated        1. Atrial flutter  LAB: PT (Prothrombin Time) (161096) LAB: Basic Metabolic Notes: Given evidence of both 4-1 conduction on prior EKG as well as more rapid conduction on auscultation today paroxysmal. Sleep apnea, obesity playing a role. Prior history of rheumatic heart disease possibly playing a role as well. Because of paroxysmal tachycardia, I would like to proceed with transesophageal echocardiogram with cardioversion on Monday. Continue taking anticoagulation Xarelto started 09/10/13. Risks and benefits of procedure have been explained. Questioning. Wife present. Discuss the possibility of tachycardia-induced cardiomyopathy. He does not have persistent tachycardia however. Certainly his symptoms may be secondary to atrial arrhythmia.       2. Hypercholesteremia, pure  Notes: Diet, exercise.       3. Anticoagulant monitoring  Notes: Lab work reviewed.       4. Obesity  Notes: Weight loss helpful in this situation. Encourage.       5. Obstructive sleep apnea (adult) (pediatric)  Notes: Encourage CPAP treatment/compliance.        Procedures:  Venipuncture:  Venipuncture: Smith,Michele 09/11/2013 11:36:47 AM > , performed in right arm.           Labs:    Lab: PT (Prothrombin Time)  (045409)  Prothrombin Time 12.7 H 9.1-12.0 - SEC  INR 1.2  0.8-1.2 -    Harlon Kutner 09/12/2013 07:05:46 AM >         Lab: Basic Metabolic Creat 0.87  GLUCOSE 87  70-99 - mg/dL  BUN 18  8-11 - mg/dL  CREATININE 9.14  7.82-9.56 - mg/dl  eGFR (NON-AFRICAN AMERICAN) 89  >60 - calc  eGFR (AFRICAN AMERICAN) 107  >60 - calc  SODIUM 141  136-145 - mmol/L  POTASSIUM 4.7  3.5-5.5 - mmol/L  CHLORIDE 107  98-107 - mmol/L  C02 30  22-32 - mmol/L  ANION GAP 8.4  6.0-20.0 - mmol/L  CALCIUM 9.8  8.6-10.3 - mg/dL   [facility] 21/30/8657 03:54:34 PM > ok for cardioversion          Procedure Codes: 84696 ECL BMP, 29528 BLOOD COLLECTION ROUTINE VENIPUNCTURE       Preventive:   CC: Dr. Merri Brunette.       Follow Up: post cardioversion

## 2013-09-15 NOTE — Interval H&P Note (Signed)
History and Physical Interval Note:  09/15/2013 10:25 AM  Jerome Kent  has presented today for surgery, with the diagnosis of AFIB  The various methods of treatment have been discussed with the patient and family. After consideration of risks, benefits and other options for treatment, the patient has consented to  Procedure(s): TRANSESOPHAGEAL ECHOCARDIOGRAM (TEE) (N/A) CARDIOVERSION (N/A) as a surgical intervention .  The patient's history has been reviewed, patient examined, no change in status, stable for surgery.  I have reviewed the patient's chart and labs.  Questions were answered to the patient's satisfaction.     Lacrystal Barbe

## 2013-09-16 ENCOUNTER — Encounter (HOSPITAL_COMMUNITY): Payer: Self-pay | Admitting: Cardiology

## 2013-10-05 ENCOUNTER — Encounter: Payer: Self-pay | Admitting: *Deleted

## 2013-10-05 ENCOUNTER — Encounter: Payer: Self-pay | Admitting: Cardiology

## 2013-10-08 ENCOUNTER — Encounter: Payer: Self-pay | Admitting: Cardiology

## 2013-10-08 ENCOUNTER — Ambulatory Visit (INDEPENDENT_AMBULATORY_CARE_PROVIDER_SITE_OTHER): Payer: BC Managed Care – PPO | Admitting: Cardiology

## 2013-10-08 VITALS — BP 124/74 | HR 50 | Ht 68.0 in | Wt 295.0 lb

## 2013-10-08 DIAGNOSIS — I428 Other cardiomyopathies: Secondary | ICD-10-CM

## 2013-10-08 DIAGNOSIS — I5022 Chronic systolic (congestive) heart failure: Secondary | ICD-10-CM | POA: Insufficient documentation

## 2013-10-08 DIAGNOSIS — I429 Cardiomyopathy, unspecified: Secondary | ICD-10-CM | POA: Insufficient documentation

## 2013-10-08 DIAGNOSIS — E669 Obesity, unspecified: Secondary | ICD-10-CM

## 2013-10-08 NOTE — Progress Notes (Signed)
[address] [place], [place]  [address] Phone: [phone] Fax:  [phone]  Date:  10/08/2013   ID:  Jerome Kent, DOB 1951-12-01, MRN [id number]  PCP:  Allean Found, MD   History of Present Illness: Jerome Kent is a 62 y.o. male with obstructive sleep apnea on CPAP, hyperlipidemia, ED with previous atrial flutter, typical, negative in inferior leads with 4-1 conduction previously status post cardioversion on 9/14 with cardiomyopathy, ejection fraction of 15% noted during transesophageal echocardiogram, newly diagnosed.  On 09/22/13, I increased his metoprolol to 50 mg and started him on lisinopril 5 mg. Continued with Lasix. Plan was to repeat echocardiogram. He is also been on anticoagulation with Xarelto preceding up to cardioversion and following.  Overall, he is doing fairly well. He's not as significantly short of breath as previously described. No chest pain, no syncope.   Wt Readings from Last 3 Encounters:  10/08/13 295 lb (133.811 kg)  09/15/13 306 lb (138.801 kg)  09/15/13 306 lb (138.801 kg)     Past Medical History  Diagnosis Date  . Shortness of breath   . Sleep apnea   . Dysrhythmia     atrial flutter  . Hyperlipidemia   . Atrial flutter   . Obesity   . Cardiomyopathy   . Acute systolic heart failure   . OSA (obstructive sleep apnea)   . Erectile dysfunction   . Osteoarthritis     Past Surgical History  Procedure Laterality Date  . Total hip arthroplasty Right 2012  . Appendectomy    . Tee without cardioversion N/A 09/15/2013    Procedure: TRANSESOPHAGEAL ECHOCARDIOGRAM (TEE);  Surgeon: [name], MD;  Location: [facility] ENDOSCOPY;  Service: Cardiovascular;  Laterality: N/A;  . Cardioversion N/A 09/15/2013    Procedure: CARDIOVERSION;  Surgeon: [name], MD;  Location: [facility] ENDOSCOPY;  Service: Cardiovascular;  Laterality: N/A;    Current Outpatient Prescriptions  Medication Sig Dispense Refill  . atorvastatin  (LIPITOR) 40 MG tablet Take 40 mg by mouth daily.      . furosemide (LASIX) 20 MG tablet Take 1 tablet (20 mg total) by mouth daily.  30 tablet  3  . lisinopril (PRINIVIL,ZESTRIL) 5 MG tablet Take 1 tablet by mouth daily.      . metoprolol succinate (TOPROL-XL) 50 MG 24 hr tablet Take 1 tablet by mouth daily.      Carlena Hurl 20 MG TABS tablet Take 1 tablet by mouth daily.       No current facility-administered medications for this visit.    Allergies:   No Known Allergies  Social History:  The patient  reports that he quit smoking about 18 years ago. He does not have any smokeless tobacco history on file. He reports that he drinks about 3.6 ounces of alcohol per week. He reports that he does not use illicit drugs.   ROS:  Please see the history of present illness.   No significant orthopnea, no chest pain. Improved shortness of breath. CPAP compliance.   All other systems reviewed and negative.   PHYSICAL EXAM: VS:  BP 124/74  Pulse 50  Ht 5\' 8"  (1.727 m)  Wt 295 lb (133.811 kg)  BMI 44.86 kg/m2 Well nourished, well developed, in no acute distress HEENT: normal Neck: no JVD Cardiac:  normal S1, S2; RRR; no murmur Lungs:  clear to auscultation bilaterally, no wheezing, rhonchi or rales Abd: soft, nontender, no hepatomegaly Ext: no edema Skin: warm and dry Neuro: no  focal abnormalities noted  EKG:  Sinus bradycardia at 50, T wave inversion, nonspecific in V3 through V5.     ASSESSMENT AND PLAN:  1. Cardiomyopathy-possible tachycardia induced. Repeat echocardiogram. It is been approximately 3-4 weeks since cardioversion. Continue with Toprol 50 mg. Unable to up titrate secondary to bradycardia. We will continue with current dose ACE inhibitor. 2. Atrial flutter - doing well post cardioversion. Maintaining sinus rhythm/sinus bradycardia. 3. Obesity-weight loss. 4. Chronic systolic heart failure-if ejection fraction not improved, we will likely proceed with cardiac catheterization to  exclude coronary artery disease. Other possible etiologies at that point will be viral cardiomyopathy.  Signed, [name], MD [facility]  10/08/2013 1:39 PM

## 2013-10-08 NOTE — Patient Instructions (Signed)
Your physician recommends that you continue on your current medications as directed. Please refer to the Current Medication list given to you today.  Your physician has requested that you have an echocardiogram. Echocardiography is a painless test that uses sound waves to create images of your heart. It provides your doctor with information about the size and shape of your heart and how well your heart's chambers and valves are working. This procedure takes approximately one hour. There are no restrictions for this procedure.  Your physician recommends that you schedule a follow-up appointment in: 2 weeks with Dr. Anne Fu

## 2013-10-09 ENCOUNTER — Ambulatory Visit (HOSPITAL_COMMUNITY): Payer: BC Managed Care – PPO | Attending: Cardiology

## 2013-10-09 DIAGNOSIS — R0609 Other forms of dyspnea: Secondary | ICD-10-CM | POA: Insufficient documentation

## 2013-10-09 DIAGNOSIS — I429 Cardiomyopathy, unspecified: Secondary | ICD-10-CM

## 2013-10-09 DIAGNOSIS — I4892 Unspecified atrial flutter: Secondary | ICD-10-CM | POA: Insufficient documentation

## 2013-10-09 DIAGNOSIS — I509 Heart failure, unspecified: Secondary | ICD-10-CM | POA: Insufficient documentation

## 2013-10-09 DIAGNOSIS — R0989 Other specified symptoms and signs involving the circulatory and respiratory systems: Secondary | ICD-10-CM | POA: Insufficient documentation

## 2013-10-09 DIAGNOSIS — I428 Other cardiomyopathies: Secondary | ICD-10-CM | POA: Insufficient documentation

## 2013-10-09 DIAGNOSIS — I5022 Chronic systolic (congestive) heart failure: Secondary | ICD-10-CM

## 2013-10-09 NOTE — Progress Notes (Signed)
Echocardiogram performed.  

## 2013-10-13 ENCOUNTER — Telehealth: Payer: Self-pay | Admitting: Cardiology

## 2013-10-13 NOTE — Progress Notes (Signed)
Left message on voicemail for patient to call the office.

## 2013-10-13 NOTE — Telephone Encounter (Signed)
Follow Up ° °Pt returning call.  °

## 2013-10-21 ENCOUNTER — Encounter: Payer: Self-pay | Admitting: Cardiology

## 2013-10-23 ENCOUNTER — Ambulatory Visit (INDEPENDENT_AMBULATORY_CARE_PROVIDER_SITE_OTHER): Payer: BC Managed Care – PPO | Admitting: Cardiology

## 2013-10-23 ENCOUNTER — Encounter: Payer: Self-pay | Admitting: Cardiology

## 2013-10-23 ENCOUNTER — Encounter (INDEPENDENT_AMBULATORY_CARE_PROVIDER_SITE_OTHER): Payer: Self-pay

## 2013-10-23 VITALS — BP 122/84 | HR 67 | Ht 66.0 in | Wt 296.0 lb

## 2013-10-23 DIAGNOSIS — I428 Other cardiomyopathies: Secondary | ICD-10-CM

## 2013-10-23 DIAGNOSIS — E669 Obesity, unspecified: Secondary | ICD-10-CM

## 2013-10-23 DIAGNOSIS — I429 Cardiomyopathy, unspecified: Secondary | ICD-10-CM

## 2013-10-23 DIAGNOSIS — I5022 Chronic systolic (congestive) heart failure: Secondary | ICD-10-CM

## 2013-10-23 DIAGNOSIS — Z01812 Encounter for preprocedural laboratory examination: Secondary | ICD-10-CM

## 2013-10-23 DIAGNOSIS — I4892 Unspecified atrial flutter: Secondary | ICD-10-CM

## 2013-10-23 LAB — CBC WITH DIFFERENTIAL/PLATELET
Basophils Absolute: 0 10*3/uL (ref 0.0–0.1)
Basophils Relative: 0.4 % (ref 0.0–3.0)
Eosinophils Absolute: 0.1 10*3/uL (ref 0.0–0.7)
Eosinophils Relative: 1.2 % (ref 0.0–5.0)
HCT: 43 % (ref 39.0–52.0)
Hemoglobin: 14.7 g/dL (ref 13.0–17.0)
Lymphocytes Relative: 38.9 % (ref 12.0–46.0)
Lymphs Abs: 2.2 10*3/uL (ref 0.7–4.0)
MCHC: 34.2 g/dL (ref 30.0–36.0)
MCV: 91.6 fl (ref 78.0–100.0)
Monocytes Absolute: 0.5 10*3/uL (ref 0.1–1.0)
Monocytes Relative: 9.7 % (ref 3.0–12.0)
Neutro Abs: 2.8 10*3/uL (ref 1.4–7.7)
Neutrophils Relative %: 49.8 % (ref 43.0–77.0)
Platelets: 198 10*3/uL (ref 150.0–400.0)
RBC: 4.7 Mil/uL (ref 4.22–5.81)
RDW: 13.4 % (ref 11.5–14.6)
WBC: 5.6 10*3/uL (ref 4.5–10.5)

## 2013-10-23 LAB — BASIC METABOLIC PANEL
BUN: 16 mg/dL (ref 6–23)
CO2: 26 mEq/L (ref 19–32)
Calcium: 9.3 mg/dL (ref 8.4–10.5)
Chloride: 103 mEq/L (ref 96–112)
Creatinine, Ser: 0.7 mg/dL (ref 0.4–1.5)
GFR: 129.79 mL/min (ref >60.00)
Glucose, Bld: 102 mg/dL — ABNORMAL HIGH (ref 70–99)
Potassium: 4.3 mEq/L (ref 3.5–5.1)
Sodium: 139 mEq/L (ref 135–145)

## 2013-10-23 LAB — PROTIME-INR
INR: 1.6 ratio — ABNORMAL HIGH (ref 0.8–1.0)
Prothrombin Time: 16.4 s — ABNORMAL HIGH (ref 10.2–12.4)

## 2013-10-23 NOTE — Patient Instructions (Signed)
Your physician has requested that you have a  Left cardiac catheterization. Cardiac catheterization is used to diagnose and/or treat various heart conditions. Doctors may recommend this procedure for a number of different reasons. The most common reason is to evaluate chest pain. Chest pain can be a symptom of coronary artery disease (CAD), and cardiac catheterization can show whether plaque is narrowing or blocking your heart's arteries. This procedure is also used to evaluate the valves, as well as measure the blood flow and oxygen levels in different parts of your heart. For further information please visit [email or web address] Please follow instruction sheet, as given.  Your physician recommends that you have labs today: PTINR, BMET, CBC  Your physician recommends that you continue on your current medications as directed. Please refer to the Current Medication list given to you today.

## 2013-10-23 NOTE — Progress Notes (Signed)
[address] [place], [place]  [address] Phone: [phone] Fax:  [phone]  Date:  10/23/2013   ID:  Jerome Kent, DOB 1951/04/07, MRN [id number]  PCP:  Allean Found, MD   History of Present Illness: Jerome Kent is a 62 y.o. male with obstructive sleep apnea on CPAP, hyperlipidemia, ED with previous atrial flutter, typical, negative in inferior leads with 4-1 conduction previously status post cardioversion on 9/14 with cardiomyopathy, ejection fraction of 15% noted during transesophageal echocardiogram, newly diagnosed.  On 09/22/13, I increased his metoprolol to 50 mg and started him on lisinopril 5 mg. Continued with Lasix. A repeat echocardiogram shows improvement in his EF but it is still in the 35% range.  10/23/13-this morning he feels a little "off ". Perhaps maybe mildly lightheaded. Didn't have much appetite this morning. No significant trouble breathing. No chest pain. On auscultation, appears to still be in normal rhythm. We discussed heart catheterization to exclude coronary artery disease today.  Wt Readings from Last 3 Encounters:  10/23/13 296 lb (134.265 kg)  10/08/13 295 lb (133.811 kg)  09/15/13 306 lb (138.801 kg)     Past Medical History  Diagnosis Date  . Shortness of breath   . Sleep apnea   . Dysrhythmia     atrial flutter  . Hyperlipidemia   . Atrial flutter   . Obesity   . Cardiomyopathy   . Acute systolic heart failure   . OSA (obstructive sleep apnea)   . Erectile dysfunction   . Osteoarthritis   . Chronic systolic heart failure     EF 32% on TEE 9/14 during cardioversion    Past Surgical History  Procedure Laterality Date  . Total hip arthroplasty Right 2012  . Appendectomy    . Tee without cardioversion N/A 09/15/2013    Procedure: TRANSESOPHAGEAL ECHOCARDIOGRAM (TEE);  Surgeon: [name], MD;  Location: [facility] ENDOSCOPY;  Service: Cardiovascular;  Laterality: N/A;  . Cardioversion N/A 09/15/2013    Procedure: CARDIOVERSION;  Surgeon: [name], MD;  Location: [facility] ENDOSCOPY;  Service: Cardiovascular;  Laterality: N/A;    Current Outpatient Prescriptions  Medication Sig Dispense Refill  . amoxicillin (AMOXIL) 500 MG capsule Take 500 mg by mouth as needed. Before procedures for Dentist      . atorvastatin (LIPITOR) 40 MG tablet Take 40 mg by mouth daily.      . furosemide (LASIX) 20 MG tablet Take 1 tablet (20 mg total) by mouth daily.  30 tablet  3  . lisinopril (PRINIVIL,ZESTRIL) 5 MG tablet Take 1 tablet by mouth daily.      . metoprolol succinate (TOPROL-XL) 50 MG 24 hr tablet Take 1 tablet by mouth daily.      Carlena Hurl 20 MG TABS tablet Take 1 tablet by mouth daily.       No current facility-administered medications for this visit.    Allergies:   No Known Allergies  Social History:  The patient  reports that he quit smoking about 18 years ago. He does not have any smokeless tobacco history on file. He reports that he drinks about 3.6 ounces of alcohol per week. He reports that he does not use illicit drugs.   ROS:  Please see the history of present illness.   No significant orthopnea, no chest pain. Improved shortness of breath. CPAP compliance.   All other systems reviewed and negative.   PHYSICAL EXAM: VS:  BP 122/84  Pulse 67  Ht 5\' 6"  (  1.676 m)  Wt 296 lb (134.265 kg)  BMI 47.8 kg/m2 Well nourished, well developed, in no acute distress HEENT: normal Neck: no JVD Cardiac:  normal S1, S2; RRR; no murmur Lungs:  clear to auscultation bilaterally, no wheezing, rhonchi or rales Abd: soft, nontender, no hepatomegalyObese Ext: no edema Skin: warm and dry Neuro: no focal abnormalities noted  EKG:  10/07/13 :Sinus bradycardia at 50, T wave inversion, nonspecific in V3 through V5.     ASSESSMENT AND PLAN:  1. Cardiomyopathy-possible tachycardia induced.  Continue with Toprol 50 mg. Unable to up titrate secondary to bradycardia. We will continue with current dose ACE  inhibitor. This morning symptoms, I will not change medications at this point. 2. Atrial flutter - doing well post cardioversion. Maintaining sinus rhythm/sinus bradycardia. He will need to hold his anticoagulation 2 days prior to heart catheterization via the radial artery approach. 3. Obesity-weight loss. 4.   Chronic systolic heart failure- discussed heart catheterization, right radial. Risks and benefits reviewed including stroke, heart attack, death, renal impairment, bleeding. Questions were answered.  Signed, [name], MD [facility]  10/23/2013 9:30 AM

## 2013-10-24 ENCOUNTER — Encounter (HOSPITAL_COMMUNITY): Payer: Self-pay | Admitting: Pharmacy Technician

## 2013-10-29 ENCOUNTER — Encounter (HOSPITAL_COMMUNITY)
Admission: RE | Disposition: A | Discharge status: Home / Self Care | Payer: Self-pay | Source: Ambulatory Visit | Attending: Cardiology

## 2013-10-29 ENCOUNTER — Ambulatory Visit (HOSPITAL_COMMUNITY)
Admission: RE | Admit: 2013-10-29 | Discharge: 2013-10-29 | Disposition: A | Discharge status: Home / Self Care | Payer: BC Managed Care – PPO | Source: Ambulatory Visit | Attending: Cardiology | Admitting: Cardiology

## 2013-10-29 DIAGNOSIS — G4733 Obstructive sleep apnea (adult) (pediatric): Secondary | ICD-10-CM | POA: Insufficient documentation

## 2013-10-29 DIAGNOSIS — I428 Other cardiomyopathies: Secondary | ICD-10-CM

## 2013-10-29 DIAGNOSIS — E669 Obesity, unspecified: Secondary | ICD-10-CM | POA: Insufficient documentation

## 2013-10-29 DIAGNOSIS — E785 Hyperlipidemia, unspecified: Secondary | ICD-10-CM | POA: Insufficient documentation

## 2013-10-29 DIAGNOSIS — I5022 Chronic systolic (congestive) heart failure: Secondary | ICD-10-CM | POA: Insufficient documentation

## 2013-10-29 DIAGNOSIS — I429 Cardiomyopathy, unspecified: Secondary | ICD-10-CM

## 2013-10-29 DIAGNOSIS — I4892 Unspecified atrial flutter: Secondary | ICD-10-CM | POA: Diagnosis present

## 2013-10-29 HISTORY — PX: LEFT HEART CATHETERIZATION WITH CORONARY ANGIOGRAM: SHX5451

## 2013-10-29 SURGERY — LEFT HEART CATHETERIZATION WITH CORONARY ANGIOGRAM
Anesthesia: LOCAL

## 2013-10-29 MED ORDER — SODIUM CHLORIDE 0.9 % IJ SOLN: 3.0000 mL | Freq: Two times a day (BID) | INTRAMUSCULAR | Status: DC

## 2013-10-29 MED ORDER — HEPARIN SODIUM (PORCINE) 1000 UNIT/ML IJ SOLN
INTRAMUSCULAR | Status: AC
  Filled 2013-10-29: qty 1

## 2013-10-29 MED ORDER — ASPIRIN 81 MG PO CHEW
CHEWABLE_TABLET | ORAL | Status: AC
  Filled 2013-10-29: qty 4

## 2013-10-29 MED ORDER — ONDANSETRON HCL 4 MG/2ML IJ SOLN: 4.0000 mg | Freq: Four times a day (QID) | INTRAMUSCULAR | Status: DC | PRN

## 2013-10-29 MED ORDER — FENTANYL CITRATE 0.05 MG/ML IJ SOLN
INTRAMUSCULAR | Status: AC
  Filled 2013-10-29: qty 2

## 2013-10-29 MED ORDER — FUROSEMIDE 20 MG PO TABS: 20.0000 mg | ORAL_TABLET | ORAL | Status: DC

## 2013-10-29 MED ORDER — LIDOCAINE HCL (PF) 1 % IJ SOLN
INTRAMUSCULAR | Status: AC
  Filled 2013-10-29: qty 30

## 2013-10-29 MED ORDER — SODIUM CHLORIDE 0.9 % IJ SOLN: 3.0000 mL | INTRAMUSCULAR | Status: DC | PRN

## 2013-10-29 MED ORDER — SODIUM CHLORIDE 0.9 % IV SOLN: 1.0000 mL/kg/h | INTRAVENOUS | Status: DC

## 2013-10-29 MED ORDER — HEPARIN (PORCINE) IN NACL 2-0.9 UNIT/ML-% IJ SOLN
INTRAMUSCULAR | Status: AC
  Filled 2013-10-29: qty 1000

## 2013-10-29 MED ORDER — ASPIRIN 81 MG PO CHEW
324.0000 mg | CHEWABLE_TABLET | ORAL | Status: AC
  Administered 2013-10-29: 324 mg via ORAL

## 2013-10-29 MED ORDER — ACETAMINOPHEN 325 MG PO TABS
650.0000 mg | ORAL_TABLET | ORAL | Status: DC | PRN
  Administered 2013-10-29: 650 mg via ORAL
  Filled 2013-10-29: qty 2

## 2013-10-29 MED ORDER — SODIUM CHLORIDE 0.9 % IV SOLN
INTRAVENOUS | Status: DC
  Administered 2013-10-29: 08:00:00 via INTRAVENOUS

## 2013-10-29 MED ORDER — VERAPAMIL HCL 2.5 MG/ML IV SOLN
INTRAVENOUS | Status: AC
  Filled 2013-10-29: qty 2

## 2013-10-29 MED ORDER — MIDAZOLAM HCL 2 MG/2ML IJ SOLN
INTRAMUSCULAR | Status: AC
  Filled 2013-10-29: qty 2

## 2013-10-29 MED ORDER — SODIUM CHLORIDE 0.9 % IV SOLN: 250.0000 mL | INTRAVENOUS | Status: DC | PRN

## 2013-10-29 MED ORDER — NITROGLYCERIN 0.2 MG/ML ON CALL CATH LAB
INTRAVENOUS | Status: AC
  Filled 2013-10-29: qty 1

## 2013-10-29 NOTE — CV Procedure (Signed)
    CARDIAC CATHETERIZATION  PROCEDURE:  Left heart catheterization with selective coronary angiography, left ventriculogram via the radial artery approach.  INDICATIONS:  62 year old male with newly diagnosed cardiomyopathy, acute systolic heart failure possible viral, possible tachycardia induced after atrial flutter/fibrillation was noted. This catheterization is being done to exclude the possibility of coronary artery disease as reason for cardiomyopathy. Repeat echocardiogram still demonstrated reduced ejection fraction in the 35% to 40% range. Original EF was approximately 15-20%. This EF was performed in the setting of transesophageal echocardiogram for atrial flutter cardioversion.  The risks, benefits, and details of the procedure were explained to the patient, including possibilities of stroke, heart attack, death, renal impairment, arterial damage, bleeding.  The patient verbalized understanding and wanted to proceed.  Informed written consent was obtained.  PROCEDURE TECHNIQUE:  Allen's test was performed pre-and post procedure and was normal. The right radial artery site was prepped and draped in a sterile fashion. One percent lidocaine was used for local anesthesia. Using the modified Seldinger technique a 5 French hydrophilic sheath was inserted into the radial artery without difficulty. 3 mg of verapamil was administered via the sheath. A Judkins right #4 catheter with the guidance of a Versicore wire was placed in the right coronary cusp and selectively cannulated the right coronary artery. After traversing the aortic arch, 5000 units of heparin IV was administered. A Judkins left #3.5 catheter was used to selectively cannulate the left main artery. Multiple views with hand injection of Omnipaque were obtained. He did feel mild right arm discomfort at the antecubital fossa. 200 mcg of intra-sheath nitroglycerin was administered. Catheter a pigtail catheter was used to cross into the left  ventricle, hemodynamics were obtained, and a left ventriculogram was performed in the RAO position with power injection. Following the procedure, sheath was removed, patient was hemodynamically stable, hemostasis was maintained with a Terumo T band.   CONTRAST:  Total of 60 ml.    FLOUROSCOPY TIME: 2.6 min.  COMPLICATIONS:  None.    HEMODYNAMICS:  Aortic pressure was 97/78mmHg; LV systolic pressure was ; LVEDP .  There was no gradient between the left ventricle and aorta.    ANGIOGRAPHIC DATA:    Left main: No angiographically significant coronary artery disease.  Left anterior descending (LAD): No angiographically significant coronary artery disease  Circumflex artery (CIRC): No angiographically significant coronary artery disease.  Right coronary artery (RCA): No angiographically significant coronary artery disease. Dominant vessel.  LEFT VENTRICULOGRAM:  Left ventricular angiogram was done in the 30 RAO projection and revealed moderately reduced left ventricular ejection fraction, EF in the 40-45% range. Post-PVC noted. Global hypokinesis.  IMPRESSIONS:  No angiographically significant CAD Moderately reduced left ventricular systolic function.  LVEDP 12 mmHg.  Ejection fraction 40-45 %.  RECOMMENDATION:  EF has improved. We will continue with medical therapy. Blood pressure does not allow for further up titration of beta blocker or ACE inhibitor. I will as come to take his Lasix now on an every other day basis given blood pressure.

## 2013-10-29 NOTE — H&P (View-Only) (Signed)
     [address] [place], [place]  [address] Phone: [phone] Fax:  [phone]  Date:  10/23/2013   ID:  Jerome Kent, DOB 10/02/1951, MRN [id number]  PCP:  SMITH,CANDACE THIELE, MD   History of Present Illness: Jerome Kent is a 62 y.o. male with obstructive sleep apnea on CPAP, hyperlipidemia, ED with previous atrial flutter, typical, negative in inferior leads with 4-1 conduction previously status post cardioversion on 9/14 with cardiomyopathy, ejection fraction of 15% noted during transesophageal echocardiogram, newly diagnosed.  On 09/22/13, I increased his metoprolol to 50 mg and started him on lisinopril 5 mg. Continued with Lasix. A repeat echocardiogram shows improvement in his EF but it is still in the 35% range.  10/23/13-this morning he feels a little "off ". Perhaps maybe mildly lightheaded. Didn't have much appetite this morning. No significant trouble breathing. No chest pain. On auscultation, appears to still be in normal rhythm. We discussed heart catheterization to exclude coronary artery disease today.  Wt Readings from Last 3 Encounters:  10/23/13 296 lb (134.265 kg)  10/08/13 295 lb (133.811 kg)  09/15/13 306 lb (138.801 kg)     Past Medical History  Diagnosis Date  . Shortness of breath   . Sleep apnea   . Dysrhythmia     atrial flutter  . Hyperlipidemia   . Atrial flutter   . Obesity   . Cardiomyopathy   . Acute systolic heart failure   . OSA (obstructive sleep apnea)   . Erectile dysfunction   . Osteoarthritis   . Chronic systolic heart failure     EF 15% on TEE 9/14 during cardioversion    Past Surgical History  Procedure Laterality Date  . Total hip arthroplasty Right 2012  . Appendectomy    . Tee without cardioversion N/A 09/15/2013    Procedure: TRANSESOPHAGEAL ECHOCARDIOGRAM (TEE);  Surgeon: Demya Scruggs, MD;  Location: [facility] ENDOSCOPY;  Service: Cardiovascular;  Laterality: N/A;  . Cardioversion N/A 09/15/2013    Procedure: CARDIOVERSION;  Surgeon: Iyahna Obriant, MD;  Location: [facility] ENDOSCOPY;  Service: Cardiovascular;  Laterality: N/A;    Current Outpatient Prescriptions  Medication Sig Dispense Refill  . amoxicillin (AMOXIL) 500 MG capsule Take 500 mg by mouth as needed. Before procedures for Dentist      . atorvastatin (LIPITOR) 40 MG tablet Take 40 mg by mouth daily.      . furosemide (LASIX) 20 MG tablet Take 1 tablet (20 mg total) by mouth daily.  30 tablet  3  . lisinopril (PRINIVIL,ZESTRIL) 5 MG tablet Take 1 tablet by mouth daily.      . metoprolol succinate (TOPROL-XL) 50 MG 24 hr tablet Take 1 tablet by mouth daily.      . XARELTO 20 MG TABS tablet Take 1 tablet by mouth daily.       No current facility-administered medications for this visit.    Allergies:   No Known Allergies  Social History:  The patient  reports that he quit smoking about 18 years ago. He does not have any smokeless tobacco history on file. He reports that he drinks about 3.6 ounces of alcohol per week. He reports that he does not use illicit drugs.   ROS:  Please see the history of present illness.   No significant orthopnea, no chest pain. Improved shortness of breath. CPAP compliance.   All other systems reviewed and negative.   PHYSICAL EXAM: VS:  BP 122/84  Pulse 67  Ht 5' 6" (  1.676 m)  Wt 296 lb (134.265 kg)  BMI 47.8 kg/m2 Well nourished, well developed, in no acute distress HEENT: normal Neck: no JVD Cardiac:  normal S1, S2; RRR; no murmur Lungs:  clear to auscultation bilaterally, no wheezing, rhonchi or rales Abd: soft, nontender, no hepatomegalyObese Ext: no edema Skin: warm and dry Neuro: no focal abnormalities noted  EKG:  10/07/13 :Sinus bradycardia at 50, T wave inversion, nonspecific in V3 through V5.     ASSESSMENT AND PLAN:  1. Cardiomyopathy-possible tachycardia induced.  Continue with Toprol 50 mg. Unable to up titrate secondary to bradycardia. We will continue with current dose ACE  inhibitor. This morning symptoms, I will not change medications at this point. 2. Atrial flutter - doing well post cardioversion. Maintaining sinus rhythm/sinus bradycardia. He will need to hold his anticoagulation 2 days prior to heart catheterization via the radial artery approach. 3. Obesity-weight loss. 4.   Chronic systolic heart failure- discussed heart catheterization, right radial. Risks and benefits reviewed including stroke, heart attack, death, renal impairment, bleeding. Questions were answered.  Signed, Roshell Brigham, MD FACC  10/23/2013 9:30 AM    

## 2013-10-29 NOTE — Interval H&P Note (Signed)
History and Physical Interval Note:  10/29/2013 8:42 AM  Jerome Kent  has presented today for surgery, with the diagnosis of hf  The various methods of treatment have been discussed with the patient and family. After consideration of risks, benefits and other options for treatment, the patient has consented to  Procedure(s): LEFT HEART CATHETERIZATION WITH CORONARY ANGIOGRAM (N/A) as a surgical intervention .  The patient's history has been reviewed, patient examined, no change in status, stable for surgery.  I have reviewed the patient's chart and labs.  Questions were answered to the patient's satisfaction.     Afreen Siebels

## 2013-11-24 ENCOUNTER — Encounter: Payer: Self-pay | Admitting: Cardiology

## 2013-11-24 ENCOUNTER — Ambulatory Visit (INDEPENDENT_AMBULATORY_CARE_PROVIDER_SITE_OTHER): Payer: BC Managed Care – PPO | Admitting: Cardiology

## 2013-11-24 VITALS — BP 104/62 | HR 68 | Ht 68.0 in | Wt 297.1 lb

## 2013-11-24 DIAGNOSIS — I4892 Unspecified atrial flutter: Secondary | ICD-10-CM

## 2013-11-24 DIAGNOSIS — I5022 Chronic systolic (congestive) heart failure: Secondary | ICD-10-CM

## 2013-11-24 DIAGNOSIS — I429 Cardiomyopathy, unspecified: Secondary | ICD-10-CM

## 2013-11-24 DIAGNOSIS — I428 Other cardiomyopathies: Secondary | ICD-10-CM

## 2013-11-24 DIAGNOSIS — E669 Obesity, unspecified: Secondary | ICD-10-CM

## 2013-11-24 NOTE — Patient Instructions (Signed)
Your physician recommends that you schedule a follow-up appointment in:   2 MONTHS WITH  DR Anne Fu  Your physician recommends that you continue on your current medications as directed. Please refer to the Current Medication list given to you today.  Your physician has requested that you have an echocardiogram. Echocardiography is a painless test that uses sound waves to create images of your heart. It provides your doctor with information about the size and shape of your heart and how well your heart's chambers and valves are working. This procedure takes approximately one hour. There are no restrictions for this procedure. IN  2 MONTHS

## 2013-11-24 NOTE — Progress Notes (Signed)
[address] [place], [place]  [address] Phone: [phone] Fax:  [phone]  Date:  11/24/2013   ID:  Jerome Kent, DOB Dec 08, 1951, MRN [id number]  PCP:  Allean Found, MD   History of Present Illness: Jerome Kent is a 62 y.o. male with obstructive sleep apnea on CPAP, hyperlipidemia, ED with previous atrial flutter, typical, negative in inferior leads with 4-1 conduction previously status post cardioversion on 9/14 with cardiomyopathy, ejection fraction of 15% noted during transesophageal echocardiogram, newly diagnosed.  On 09/22/13, I increased his metoprolol to 50 mg and started him on lisinopril 5 mg. Continued with Lasix. A repeat echocardiogram shows improvement in his EF but it is still in the 35% range.  10/23/13-this morning he feels a little "off ". Perhaps maybe mildly lightheaded. Didn't have much appetite this morning. No significant trouble breathing. No chest pain. On auscultation, appears to still be in normal rhythm. We discussed heart catheterization to exclude coronary artery disease today.  11/24/13-cardiac catheterization took place and showed no evidence of coronary artery disease. Ejection fraction seem improved to 40-45%. Blood pressure did not allow further up titration of beta blocker or ACE inhibitor. Lasix is every other day (although actually he has been taking it mostly daily.) Overall he is feeling better. Improved. No significant shortness of breath he states. No chest pain.  Wt Readings from Last 3 Encounters:  11/24/13 297 lb 1.9 oz (134.773 kg)  10/29/13 296 lb (134.265 kg)  10/29/13 296 lb (134.265 kg)     Past Medical History  Diagnosis Date  . Shortness of breath   . Sleep apnea   . Dysrhythmia     atrial flutter  . Hyperlipidemia   . Atrial flutter   . Obesity   . Cardiomyopathy   . Acute systolic heart failure   . OSA (obstructive sleep apnea)   . Erectile dysfunction   . Osteoarthritis   .  Chronic systolic heart failure     EF 02% on TEE 9/14 during cardioversion    Past Surgical History  Procedure Laterality Date  . Total hip arthroplasty Right 2012  . Appendectomy    . Tee without cardioversion N/A 09/15/2013    Procedure: TRANSESOPHAGEAL ECHOCARDIOGRAM (TEE);  Surgeon: [name], MD;  Location: [facility] ENDOSCOPY;  Service: Cardiovascular;  Laterality: N/A;  . Cardioversion N/A 09/15/2013    Procedure: CARDIOVERSION;  Surgeon: [name], MD;  Location: [facility] ENDOSCOPY;  Service: Cardiovascular;  Laterality: N/A;    Current Outpatient Prescriptions  Medication Sig Dispense Refill  . amoxicillin (AMOXIL) 500 MG capsule Take 500 mg by mouth as needed. Before procedures for Dentist      . atorvastatin (LIPITOR) 40 MG tablet Take 40 mg by mouth daily.      . furosemide (LASIX) 20 MG tablet Take 1 tablet (20 mg total) by mouth every other day.  30 tablet  3  . lisinopril (PRINIVIL,ZESTRIL) 5 MG tablet Take 1 tablet by mouth daily.      . metoprolol succinate (TOPROL-XL) 50 MG 24 hr tablet Take 1 tablet by mouth daily.      Carlena Hurl 20 MG TABS tablet Take 1 tablet by mouth daily.       No current facility-administered medications for this visit.    Allergies:   No Known Allergies  Social History:  The patient  reports that he quit smoking about 18 years ago. He does not have any smokeless tobacco history on file.  He reports that he drinks about 3.6 ounces of alcohol per week. He reports that he does not use illicit drugs.   ROS:  Please see the history of present illness.   No significant orthopnea, no chest pain. Improved shortness of breath. CPAP compliance.   All other systems reviewed and negative.   PHYSICAL EXAM: VS:  BP 104/62  Pulse 68  Ht 5\' 8"  (1.727 m)  Wt 297 lb 1.9 oz (134.773 kg)  BMI 45.19 kg/m2 Well nourished, well developed, in no acute distress HEENT: normal Neck: no JVD Cardiac:  normal S1, S2; RRR; no murmur Lungs:  clear to auscultation bilaterally,  no wheezing, rhonchi or rales Abd: soft, nontender, no hepatomegalyObese Ext: no edema2+ radial pulse site Skin: warm and dry Neuro: no focal abnormalities noted  EKG:  10/07/13 :Sinus bradycardia at 50, T wave inversion, nonspecific in V3 through V5.     ASSESSMENT AND PLAN:  1. Cardiomyopathy-possible tachycardia induced.  Continue with Toprol 50 mg. Unable to up titrate secondary to bradycardia. We will continue with current dose ACE inhibitor.  2. Atrial flutter - doing well post cardioversion. Maintaining sinus rhythm/sinus bradycardia. We discussed possible electrophysiology evaluation is atrial flutter were to occur again. 3. Obesity-weight loss. Discussed low carbohydrate diet 4.   Chronic systolic heart failure-reassuring angiogram. Nonischemic cardiomyopathy. We will check echocardiogram in 2 months. Some give adequate timing of full medical therapy. It is been in purging that his ejection fraction seems to be improving throughout this process. Initial date of atrial flutter/cardioversion was 09/15/13. Signed, [name], MD [facility]  11/24/2013 10:18 AM

## 2014-01-12 ENCOUNTER — Other Ambulatory Visit: Payer: Self-pay

## 2014-01-12 MED ORDER — FUROSEMIDE 20 MG PO TABS: 20.0000 mg | ORAL_TABLET | ORAL | Status: DC

## 2014-01-26 ENCOUNTER — Other Ambulatory Visit: Payer: Self-pay

## 2014-01-26 ENCOUNTER — Ambulatory Visit (HOSPITAL_COMMUNITY): Payer: BC Managed Care – PPO | Attending: Cardiovascular Disease | Admitting: Radiology

## 2014-01-26 ENCOUNTER — Encounter: Payer: Self-pay | Admitting: Cardiovascular Disease

## 2014-01-26 DIAGNOSIS — I428 Other cardiomyopathies: Secondary | ICD-10-CM

## 2014-01-26 DIAGNOSIS — G4733 Obstructive sleep apnea (adult) (pediatric): Secondary | ICD-10-CM | POA: Insufficient documentation

## 2014-01-26 DIAGNOSIS — I5022 Chronic systolic (congestive) heart failure: Secondary | ICD-10-CM

## 2014-01-26 DIAGNOSIS — I509 Heart failure, unspecified: Secondary | ICD-10-CM

## 2014-01-26 DIAGNOSIS — E785 Hyperlipidemia, unspecified: Secondary | ICD-10-CM | POA: Insufficient documentation

## 2014-01-26 DIAGNOSIS — I4892 Unspecified atrial flutter: Secondary | ICD-10-CM

## 2014-01-26 DIAGNOSIS — Z87891 Personal history of nicotine dependence: Secondary | ICD-10-CM | POA: Insufficient documentation

## 2014-01-26 DIAGNOSIS — I429 Cardiomyopathy, unspecified: Secondary | ICD-10-CM

## 2014-01-26 DIAGNOSIS — I5020 Unspecified systolic (congestive) heart failure: Secondary | ICD-10-CM | POA: Insufficient documentation

## 2014-01-26 NOTE — Progress Notes (Signed)
Echocardiogram performed.  

## 2014-01-27 ENCOUNTER — Encounter: Payer: Self-pay | Admitting: Cardiology

## 2014-01-28 ENCOUNTER — Telehealth: Payer: Self-pay | Admitting: Cardiology

## 2014-01-28 NOTE — Progress Notes (Signed)
Left message for patient to call the office

## 2014-01-28 NOTE — Telephone Encounter (Signed)
New message ° ° ° °Returning Kenyatta's call °

## 2014-01-30 ENCOUNTER — Ambulatory Visit (INDEPENDENT_AMBULATORY_CARE_PROVIDER_SITE_OTHER): Payer: BC Managed Care – PPO | Admitting: Cardiology

## 2014-01-30 ENCOUNTER — Ambulatory Visit: Payer: BC Managed Care – PPO | Admitting: Cardiology

## 2014-01-30 ENCOUNTER — Encounter: Payer: Self-pay | Admitting: Cardiology

## 2014-01-30 VITALS — BP 124/74 | HR 65 | Ht 69.0 in | Wt 295.0 lb

## 2014-01-30 DIAGNOSIS — I429 Cardiomyopathy, unspecified: Secondary | ICD-10-CM

## 2014-01-30 DIAGNOSIS — I5022 Chronic systolic (congestive) heart failure: Secondary | ICD-10-CM

## 2014-01-30 DIAGNOSIS — I428 Other cardiomyopathies: Secondary | ICD-10-CM

## 2014-01-30 DIAGNOSIS — I4892 Unspecified atrial flutter: Secondary | ICD-10-CM

## 2014-01-30 DIAGNOSIS — E669 Obesity, unspecified: Secondary | ICD-10-CM

## 2014-01-30 NOTE — Telephone Encounter (Signed)
Spoke with patient - advised of results

## 2014-01-30 NOTE — Progress Notes (Signed)
[address] [place], [place]  [address] Phone: [phone] Fax:  [phone]  Date:  01/30/2014   ID:  Jerome Kent, DOB Aug 27, 1951, MRN [id number]  PCP:  Allean Found, MD   History of Present Illness: Jerome Kent is a 63 y.o. male with obstructive sleep apnea on CPAP, hyperlipidemia, ED with previous atrial flutter, typical, negative in inferior leads with 4-1 conduction previously status post cardioversion on 9/14 with cardiomyopathy, ejection fraction of 15% noted during transesophageal echocardiogram, newly diagnosed.  On 09/22/13, I increased his metoprolol to 50 mg and started him on lisinopril 5 mg. Continued with Lasix. A repeat echocardiogram shows improvement in his EF but it is still in the 35% range.  10/23/13-this morning he feels a little "off ". Perhaps maybe mildly lightheaded. Didn't have much appetite this morning. No significant trouble breathing. No chest pain. On auscultation, appears to still be in normal rhythm. We discussed heart catheterization to exclude coronary artery disease today.  11/24/13-cardiac catheterization took place and showed no evidence of coronary artery disease. Ejection fraction seem improved to 40-45%. Blood pressure did not allow further up titration of beta blocker or ACE inhibitor. Lasix is every other day (although actually he has been taking it mostly daily.) Overall he is feeling better. Improved. No significant shortness of breath he states. No chest pain.  01/30/14-has been doing very well. I reviewed most recent echocardiogram from 2/15. EF remains at 40%. There is no clear evidence of apical thrombus on personally viewed. Nonetheless, remains on anticoagulation. He is maintaining sinus rhythm.  Wt Readings from Last 3 Encounters:  01/30/14 295 lb (133.811 kg)  11/24/13 297 lb 1.9 oz (134.773 kg)  10/29/13 296 lb (134.265 kg)     Past Medical History  Diagnosis Date  . Shortness of breath     . Sleep apnea   . Dysrhythmia     atrial flutter  . Hyperlipidemia   . Atrial flutter   . Obesity   . Cardiomyopathy   . Acute systolic heart failure   . OSA (obstructive sleep apnea)   . Erectile dysfunction   . Osteoarthritis   . Chronic systolic heart failure     EF 64% on TEE 9/14 during cardioversion    Past Surgical History  Procedure Laterality Date  . Total hip arthroplasty Right 2012  . Appendectomy    . Tee without cardioversion N/A 09/15/2013    Procedure: TRANSESOPHAGEAL ECHOCARDIOGRAM (TEE);  Surgeon: [name], MD;  Location: [facility] ENDOSCOPY;  Service: Cardiovascular;  Laterality: N/A;  . Cardioversion N/A 09/15/2013    Procedure: CARDIOVERSION;  Surgeon: [name], MD;  Location: [facility] ENDOSCOPY;  Service: Cardiovascular;  Laterality: N/A;    Current Outpatient Prescriptions  Medication Sig Dispense Refill  . amoxicillin (AMOXIL) 500 MG capsule Take 500 mg by mouth as needed. Before procedures for Dentist      . atorvastatin (LIPITOR) 40 MG tablet Take 40 mg by mouth daily.      . furosemide (LASIX) 20 MG tablet Take 1 tablet (20 mg total) by mouth every other day.  30 tablet  3  . lisinopril (PRINIVIL,ZESTRIL) 5 MG tablet Take 1 tablet by mouth daily.      . metoprolol succinate (TOPROL-XL) 50 MG 24 hr tablet Take 1 tablet by mouth daily.      Carlena Hurl 20 MG TABS tablet Take 1 tablet by mouth daily.       No current facility-administered  medications for this visit.    Allergies:   No Known Allergies  Social History:  The patient  reports that he quit smoking about 19 years ago. He does not have any smokeless tobacco history on file. He reports that he drinks about 3.6 ounces of alcohol per week. He reports that he does not use illicit drugs.   ROS:  Please see the history of present illness.   No significant orthopnea, no chest pain. Improved shortness of breath. CPAP compliance.   All other systems reviewed and negative.   PHYSICAL EXAM: VS:  BP 124/74   Pulse 65  Ht 5\' 9"  (1.753 m)  Wt 295 lb (133.811 kg)  BMI 43.54 kg/m2 Well nourished, well developed, in no acute distress HEENT: normal Neck: no JVD Cardiac:  normal S1, S2; RRR; no murmur Lungs:  clear to auscultation bilaterally, no wheezing, rhonchi or rales Abd: soft, nontender, no hepatomegalyObese Ext: no edema2+ radial pulse site Skin: warm and dry Neuro: no focal abnormalities noted  EKG:  10/07/13 :Sinus bradycardia at 50, T wave inversion, nonspecific in V3 through V5.    Catheterization 2014-no CAD  ASSESSMENT AND PLAN:  1. Cardiomyopathy-his ejection fraction remains in the 40% range. I personally reviewed his echocardiogram from 01/26/14. There is no clear evidence of apical thrombus. Nonetheless, he remains on anticoagulation. No prior history of stroke. Discussed with he and his wife that he is above the range of defibrillator placement.  Continue with Toprol 50 mg. Unable to up titrate secondary to bradycardia. We will continue with current dose ACE inhibitor.  2. Atrial flutter - doing well post cardioversion. Maintaining sinus rhythm/sinus bradycardia. We discussed possible electrophysiology evaluation if atrial flutter were to occur again. 3. Obesity-weight loss. Discussed low carbohydrate diet 4.   Chronic systolic heart failure-reassuring angiogram. Nonischemic cardiomyopathy. Initial date of atrial flutter/cardioversion was 09/15/13. 5. We'll see back in 4 months. Signed, Donato SchultzMark Marysue Fait, MD [facility]  01/30/2014 4:35 PM

## 2014-01-30 NOTE — Patient Instructions (Signed)
Your physician recommends that you schedule a follow-up appointment in: 4 months with Dr. Anne Fu  Your physician recommends that you continue on your current medications as directed. Please refer to the Current Medication list given to you today.

## 2014-05-07 ENCOUNTER — Telehealth: Payer: Self-pay | Admitting: Cardiology

## 2014-05-07 NOTE — Telephone Encounter (Signed)
Received request from Nurse fax box, documents faxed for surgical clearance. To: Jarold Song Fax number: [phone] Attention: [id number]/[id number]

## 2014-05-11 ENCOUNTER — Other Ambulatory Visit: Payer: Self-pay | Admitting: *Deleted

## 2014-05-11 MED ORDER — FUROSEMIDE 20 MG PO TABS: 20.0000 mg | ORAL_TABLET | ORAL | Status: DC

## 2014-05-27 ENCOUNTER — Ambulatory Visit (INDEPENDENT_AMBULATORY_CARE_PROVIDER_SITE_OTHER): Payer: BC Managed Care – PPO | Admitting: Cardiology

## 2014-05-27 ENCOUNTER — Encounter: Payer: Self-pay | Admitting: Cardiology

## 2014-05-27 VITALS — BP 127/81 | HR 54 | Ht 69.0 in | Wt 285.0 lb

## 2014-05-27 DIAGNOSIS — I5022 Chronic systolic (congestive) heart failure: Secondary | ICD-10-CM

## 2014-05-27 DIAGNOSIS — I4892 Unspecified atrial flutter: Secondary | ICD-10-CM

## 2014-05-27 DIAGNOSIS — I429 Cardiomyopathy, unspecified: Secondary | ICD-10-CM

## 2014-05-27 DIAGNOSIS — I428 Other cardiomyopathies: Secondary | ICD-10-CM

## 2014-05-27 DIAGNOSIS — E669 Obesity, unspecified: Secondary | ICD-10-CM

## 2014-05-27 MED ORDER — SPIRONOLACTONE 25 MG PO TABS: 12.5000 mg | ORAL_TABLET | Freq: Every day | ORAL | Status: DC

## 2014-05-27 MED ORDER — SPIRONOLACTONE 25 MG PO TABS: 25.0000 mg | ORAL_TABLET | Freq: Every day | ORAL | Status: DC

## 2014-05-27 MED ORDER — LISINOPRIL 10 MG PO TABS: 10.0000 mg | ORAL_TABLET | Freq: Every day | ORAL | Status: DC

## 2014-05-27 NOTE — Patient Instructions (Addendum)
Your physician recommends that you schedule a follow-up appointment in: 4 MONTHS WITH DR. Anne Fu  Your physician has recommended you make the following change in your medication:  1. START SPIROLACTONE 12.5 MG 1 TAB DAILY (25 MG 1/2 TAB DAILY) 2. INCREASE LISINOPRIL 10 MG 1 TAB DAILY  Your physician recommends that you return for lab work in: 1 WEEK :BMET, CBC

## 2014-05-27 NOTE — Addendum Note (Signed)
Addended by: Kem Parkinson on: 05/27/2014 04:57 PM   Modules accepted: Orders

## 2014-05-27 NOTE — Progress Notes (Signed)
[address] [place], [place]  [address] Phone: [phone] Fax:  [phone]  Date:  05/27/2014   ID:  [name], DOB 26-Sep-1951, MRN [id number]  PCP:  Allean Found, MD   History of Present Illness: Jerome Kent is a 63 y.o. male with obstructive sleep apnea on CPAP, hyperlipidemia, ED with previous atrial flutter, typical, negative in inferior leads with 4-1 conduction previously status post cardioversion on 9/14 with cardiomyopathy, ejection fraction of 15% noted during transesophageal echocardiogram, newly diagnosed.  On 09/22/13, I increased his metoprolol to 50 mg and started him on lisinopril 5 mg. Continued with Lasix. A repeat echocardiogram shows improvement in his EF but it is still in the 35% range.  10/23/13-this morning he feels a little "off ". Perhaps maybe mildly lightheaded. Didn't have much appetite this morning. No significant trouble breathing. No chest pain. On auscultation, appears to still be in normal rhythm. We discussed heart catheterization to exclude coronary artery disease today.  11/24/13-cardiac catheterization took place and showed no evidence of coronary artery disease. Ejection fraction seem improved to 40-45%. Blood pressure did not allow further up titration of beta blocker or ACE inhibitor. Lasix is every other day (although actually he has been taking it mostly daily.) Overall he is feeling better. Improved. No significant shortness of breath he states. No chest pain.  01/30/14-has been doing very well. I reviewed most recent echocardiogram from 2/15. EF remains at 40%. There is no clear evidence of apical thrombus on personally viewed. Nonetheless, remains on anticoagulation. He is maintaining sinus rhythm.  Wt Readings from Last 3 Encounters:  05/27/14 285 lb (129.275 kg)  01/30/14 295 lb (133.811 kg)  11/24/13 297 lb 1.9 oz (134.773 kg)     Past Medical History  Diagnosis Date  . Shortness of breath   . Sleep  apnea   . Dysrhythmia     atrial flutter  . Hyperlipidemia   . Atrial flutter   . Obesity   . Cardiomyopathy   . Acute systolic heart failure   . OSA (obstructive sleep apnea)   . Erectile dysfunction   . Osteoarthritis   . Chronic systolic heart failure     EF 58% on TEE 9/14 during cardioversion    Past Surgical History  Procedure Laterality Date  . Total hip arthroplasty Right 2012  . Appendectomy    . Tee without cardioversion N/A 09/15/2013    Procedure: TRANSESOPHAGEAL ECHOCARDIOGRAM (TEE);  Surgeon: [name], MD;  Location: [facility] ENDOSCOPY;  Service: Cardiovascular;  Laterality: N/A;  . Cardioversion N/A 09/15/2013    Procedure: CARDIOVERSION;  Surgeon: [name], MD;  Location: [facility] ENDOSCOPY;  Service: Cardiovascular;  Laterality: N/A;    Current Outpatient Prescriptions  Medication Sig Dispense Refill  . amoxicillin (AMOXIL) 500 MG capsule Take 500 mg by mouth as needed. Before procedures for Dentist      . atorvastatin (LIPITOR) 40 MG tablet Take 40 mg by mouth daily.      . furosemide (LASIX) 20 MG tablet Take 1 tablet (20 mg total) by mouth every other day.  30 tablet  0  . lisinopril (PRINIVIL,ZESTRIL) 5 MG tablet Take 1 tablet by mouth daily.      . metoprolol succinate (TOPROL-XL) 50 MG 24 hr tablet Take 1 tablet by mouth daily.      Carlena Hurl 20 MG TABS tablet Take 1 tablet by mouth daily.       No current facility-administered medications for this visit.  Allergies:   No Known Allergies  Social History:  The patient  reports that he quit smoking about 19 years ago. He does not have any smokeless tobacco history on file. He reports that he drinks about 3.6 ounces of alcohol per week. He reports that he does not use illicit drugs. [employer] [place] [place]  ROS:  Please see the history of present illness.   No significant orthopnea, no chest pain. Improved shortness of breath. CPAP compliance.   All other systems reviewed and negative.     PHYSICAL EXAM: VS:  BP 127/81  Pulse 54  Ht 5\' 9"  (1.753 m)  Wt 285 lb (129.275 kg)  BMI 42.07 kg/m2 Well nourished, well developed, in no acute distress HEENT: normal Neck: no JVD Cardiac:  normal S1, S2; brady reg; no murmur Lungs:  clear to auscultation bilaterally, no wheezing, rhonchi or rales Abd: soft, nontender, no hepatomegalyObese Ext: no edema2+ radial pulse site Skin: warm and dry Neuro: no focal abnormalities noted  EKG:  10/07/13 :Sinus bradycardia at 50, T wave inversion, nonspecific in V3 through V5.  05/27/14-sinus bradycardia, 54    Catheterization 08/2013-no CAD TEE: 09/15/13 - EF 15% ECHO: 01/26/14- EF 35-40%  ASSESSMENT AND PLAN:  1. Cardiomyopathy-NICM - no CAD - In part this was tachycardia induced likely. Original ejection fraction was 15%, his ejection fraction remains in the 40% range. No prior history of stroke. Discussed with he and his wife that he is above the range of defibrillator placement.  Continue with Toprol 50 mg. Unable to up titrate secondary to bradycardia. We will increase dose of ACE inhibitor to 10mg . I. will add spironolactone 12.5 mg. CBC, basic metabolic profile next week.  2. Atrial flutter - doing well post cardioversion. Maintaining sinus rhythm/sinus bradycardia. We discussed possible electrophysiology evaluation if atrial flutter were to occur again. Cardioversion took place 09/15/13. He has maintained sinus rhythm. 3. Obesity-weight loss. Discussed low carbohydrate diet 4. OSA - CPAP. 5. Chronic systolic heart failure-reassuring angiogram, nonischemic cardiomyopathy. He is on beta blocker, ACE inhibitor, spironolactone. NYHA class I. 6. Four-month followup   Signed, Donato SchultzMark Latoria Dry, MD [facility]  05/27/2014 4:34 PM

## 2014-06-01 ENCOUNTER — Other Ambulatory Visit (INDEPENDENT_AMBULATORY_CARE_PROVIDER_SITE_OTHER): Payer: BC Managed Care – PPO

## 2014-06-01 DIAGNOSIS — I5022 Chronic systolic (congestive) heart failure: Secondary | ICD-10-CM

## 2014-06-01 DIAGNOSIS — I4892 Unspecified atrial flutter: Secondary | ICD-10-CM

## 2014-06-01 LAB — CBC WITH DIFFERENTIAL/PLATELET
Basophils Absolute: 0 10*3/uL (ref 0.0–0.1)
Basophils Relative: 0.3 % (ref 0.0–3.0)
Eosinophils Absolute: 0.1 10*3/uL (ref 0.0–0.7)
Eosinophils Relative: 0.9 % (ref 0.0–5.0)
HCT: 43.2 % (ref 39.0–52.0)
Hemoglobin: 14.2 g/dL (ref 13.0–17.0)
Lymphocytes Relative: 22.5 % (ref 12.0–46.0)
Lymphs Abs: 2 10*3/uL (ref 0.7–4.0)
MCHC: 32.9 g/dL (ref 30.0–36.0)
MCV: 91.8 fl (ref 78.0–100.0)
Monocytes Absolute: 0.6 10*3/uL (ref 0.1–1.0)
Monocytes Relative: 7.2 % (ref 3.0–12.0)
Neutro Abs: 6.1 10*3/uL (ref 1.4–7.7)
Neutrophils Relative %: 69.1 % (ref 43.0–77.0)
Platelets: 214 10*3/uL (ref 150.0–400.0)
RBC: 4.71 Mil/uL (ref 4.22–5.81)
RDW: 14.2 % (ref 11.5–15.5)
WBC: 8.8 10*3/uL (ref 4.0–10.5)

## 2014-06-01 LAB — BASIC METABOLIC PANEL
BUN: 20 mg/dL (ref 6–23)
CO2: 26 mEq/L (ref 19–32)
Calcium: 9 mg/dL (ref 8.4–10.5)
Chloride: 104 mEq/L (ref 96–112)
Creatinine, Ser: 0.8 mg/dL (ref 0.4–1.5)
GFR: 98.07 mL/min (ref >60.00)
Glucose, Bld: 99 mg/dL (ref 70–99)
Potassium: 4.3 mEq/L (ref 3.5–5.1)
Sodium: 137 mEq/L (ref 135–145)

## 2014-09-29 ENCOUNTER — Other Ambulatory Visit: Payer: Self-pay

## 2014-09-29 MED ORDER — FUROSEMIDE 20 MG PO TABS: 20.0000 mg | ORAL_TABLET | ORAL | Status: DC

## 2014-09-29 MED ORDER — RIVAROXABAN 20 MG PO TABS: 20.0000 mg | ORAL_TABLET | Freq: Every day | ORAL | Status: DC

## 2014-09-29 MED ORDER — ATORVASTATIN CALCIUM 40 MG PO TABS: 40.0000 mg | ORAL_TABLET | Freq: Every day | ORAL | Status: DC

## 2014-09-29 MED ORDER — SPIRONOLACTONE 25 MG PO TABS: 12.5000 mg | ORAL_TABLET | Freq: Every day | ORAL | Status: DC

## 2014-09-29 MED ORDER — LISINOPRIL 10 MG PO TABS: 10.0000 mg | ORAL_TABLET | Freq: Every day | ORAL | Status: DC

## 2014-09-29 MED ORDER — METOPROLOL SUCCINATE ER 50 MG PO TB24: 50.0000 mg | ORAL_TABLET | Freq: Every day | ORAL | Status: DC

## 2014-10-21 ENCOUNTER — Ambulatory Visit (INDEPENDENT_AMBULATORY_CARE_PROVIDER_SITE_OTHER): Payer: BC Managed Care – PPO | Admitting: Cardiology

## 2014-10-21 ENCOUNTER — Encounter: Payer: Self-pay | Admitting: Cardiology

## 2014-10-21 VITALS — BP 112/72 | HR 55 | Ht 69.0 in | Wt 253.0 lb

## 2014-10-21 DIAGNOSIS — I5022 Chronic systolic (congestive) heart failure: Secondary | ICD-10-CM

## 2014-10-21 DIAGNOSIS — I429 Cardiomyopathy, unspecified: Secondary | ICD-10-CM

## 2014-10-21 DIAGNOSIS — E669 Obesity, unspecified: Secondary | ICD-10-CM

## 2014-10-21 DIAGNOSIS — I4892 Unspecified atrial flutter: Secondary | ICD-10-CM

## 2014-10-21 LAB — BASIC METABOLIC PANEL
BUN: 11 mg/dL (ref 6–23)
CO2: 27 mEq/L (ref 19–32)
Calcium: 9.1 mg/dL (ref 8.4–10.5)
Chloride: 102 mEq/L (ref 96–112)
Creatinine, Ser: 0.8 mg/dL (ref 0.4–1.5)
GFR: 97.95 mL/min (ref >60.00)
Glucose, Bld: 89 mg/dL (ref 70–99)
Potassium: 3.5 mEq/L (ref 3.5–5.1)
Sodium: 136 mEq/L (ref 135–145)

## 2014-10-21 MED ORDER — SPIRONOLACTONE 25 MG PO TABS: 12.5000 mg | ORAL_TABLET | Freq: Every day | ORAL | Status: DC

## 2014-10-21 NOTE — Progress Notes (Signed)
[address] [place], [place]  [address] Phone: [phone] Fax:  [phone]  Date:  10/21/2014   ID:  Jerome MiniumMichael Merkle, DOB 01/10/1951, MRN [id number]  PCP:  Allean FoundSMITH,CANDACE THIELE, MD   History of Present Illness: Jerome MiniumMichael Petrides is a 63 y.o. male with obstructive sleep apnea on CPAP, hyperlipidemia, ED with previous atrial flutter, typical, negative in inferior leads with 4-1 conduction previously status post cardioversion on 9/14 with cardiomyopathy, ejection fraction of 15% noted during transesophageal echocardiogram, newly diagnosed.  On 09/22/13, I increased his metoprolol to 50 mg and started him on lisinopril 5 mg. Continued with Lasix. A repeat echocardiogram shows improvement in his EF but it is still in the 35% range.  10/23/13-this morning he feels a little "off ". Perhaps maybe mildly lightheaded. Didn't have much appetite this morning. No significant trouble breathing. No chest pain. On auscultation, appears to still be in normal rhythm. We discussed heart catheterization to exclude coronary artery disease today.  11/24/13-cardiac catheterization took place and showed no evidence of coronary artery disease. Ejection fraction seem improved to 40-45%. Blood pressure did not allow further up titration of beta blocker or ACE inhibitor. Lasix is every other day (although actually he has been taking it mostly daily.) Overall he is feeling better. Improved. No significant shortness of breath he states. No chest pain.  01/30/14-has been doing very well. I reviewed most recent echocardiogram from 2/15. EF remains at 40%. There is no clear evidence of apical thrombus on personally viewed. Nonetheless, remains on anticoagulation. He is maintaining sinus rhythm.  Wt Readings from Last 3 Encounters:  10/21/14 253 lb (114.76 kg)  05/27/14 285 lb (129.275 kg)  01/30/14 295 lb (133.811 kg)     Past Medical History  Diagnosis Date  . Shortness of breath   . Sleep apnea     . Dysrhythmia     atrial flutter  . Hyperlipidemia   . Atrial flutter   . Obesity   . Cardiomyopathy   . Acute systolic heart failure   . OSA (obstructive sleep apnea)   . Erectile dysfunction   . Osteoarthritis   . Chronic systolic heart failure     EF 01%15% on TEE 9/14 during cardioversion    Past Surgical History  Procedure Laterality Date  . Total hip arthroplasty Right 2012  . Appendectomy    . Tee without cardioversion N/A 09/15/2013    Procedure: TRANSESOPHAGEAL ECHOCARDIOGRAM (TEE);  Surgeon: Donato SchultzMark Leasa Kincannon, MD;  [facility] END[facility]COPY;  Service: Cardiovascular;  Laterality: N/A;  . Cardioversion N/A 09/15/2013    Procedure: CARDIOVERSION;  Surgeon: Donato SchultzMark Madysen Faircloth, MD;  [facility];  Service: Cardiovascular;  Laterality: N/A;    Current Outpatient Prescriptions  Medication Sig Dispense Refill  . amoxicillin (AMOXIL) 500 MG capsule Take 500 mg by mouth as needed. Before procedures for Dentist      . atorvastatin (LIPITOR) 40 MG tablet Take 1 tablet (40 mg total) by mouth daily.  90 tablet  1  . furosemide (LASIX) 20 MG tablet Take 1 tablet (20 mg total) by mouth every other day.  90 tablet  1  . lisinopril (PRINIVIL,ZESTRIL) 10 MG tablet Take 1 tablet (10 mg total) by mouth daily.  90 tablet  1  . metoprolol succinate (TOPROL-XL) 50 MG 24 hr tablet Take 1 tablet (50 mg total) by mouth daily.  90 tablet  1  . rivaroxaban (XARELTO) 20 MG TABS tablet Take 1 tablet (20 mg total) by mouth daily.  90 tablet  1  . spironolactone (ALDACTONE) 25 MG tablet Take 0.5 tablets (12.5 mg total) by mouth daily.  90 tablet  1   No current facility-administered medications for this visit.    Allergies:   No Known Allergies  Social History:  The patient  reports that he quit smoking about 19 years ago. He does not have any smokeless tobacco history on file. He reports that he drinks about 3.6 ounces of alcohol per week. He reports that he does not use illicit drugs. [employer],  [place], [employer]  ROS:  Please see the history of present illness.   No significant orthopnea, no chest pain. Improved shortness of breath. CPAP compliance.   All other systems reviewed and negative.   PHYSICAL EXAM: VS:  BP 112/72  Pulse 55  Ht 5\' 9"  (1.753 m)  Wt 253 lb (114.76 kg)  BMI 37.34 kg/m2 Well nourished, well developed, in no acute distress HEENT: normal Neck: no JVD Cardiac:  normal S1, S2; brady reg; no murmur Lungs:  clear to auscultation bilaterally, no wheezing, rhonchi or rales Abd: soft, nontender, no hepatomegalyObese Ext: no edema2+ radial pulse site Skin: warm and dry Neuro: no focal abnormalities noted  EKG:  10/07/13 :Sinus bradycardia at 50, T wave inversion, nonspecific in V3 through V5.  05/27/14-sinus bradycardia, 54    Catheterization 08/2013-no CAD TEE: 09/15/13 - EF 15% ECHO: 01/26/14- EF 35-40%  ASSESSMENT AND PLAN:  1. Cardiomyopathy-NICM - no CAD - In part this was tachycardia induced likely. Original ejection fraction was 15%, his ejection fraction remains in the 40% range. No prior history of stroke. Discussed previously with he and his wife that he is above the range of defibrillator placement.  Continue with Toprol 50 mg. Unable to up titrate secondary to bradycardia.  ACE inhibitor to 10mg . Spironolactone 12.5 mg.Basic metabolic profile..  2. Atrial flutter - doing well post cardioversion. Maintaining sinus rhythm/sinus bradycardia. We discussed possible electrophysiology evaluation if atrial flutter were to occur again. Cardioversion took place 09/15/13. He has maintained sinus rhythm. I am continuing with his anticoagulation because there is possibility that he may return to atrial flutter/fibrillation. He is fine with this. No bleeding, location. 3. Obesity-weight loss is excellent. From 295-253. Wonderful. Discussed low carbohydrate diet 4. OSA - CPAP. 5. Chronic systolic heart failure-reassuring angiogram, nonischemic cardiomyopathy.  He is on beta blocker, ACE inhibitor, spironolactone. NYHA class I. 6. 6 month followup   Signed, [name], MD [facility]  10/21/2014 2:47 PM

## 2014-10-21 NOTE — Patient Instructions (Signed)
Your physician recommends that you continue on your current medications as directed. Please refer to the Current Medication list given to you today.  Your physician recommends that you return for lab work today [employer]er, [employer]ell[employer])  Your physician wants you to follow-up in: 6 months with Dr. Anne Fu.  You will receive a reminder letter in the mail two months in advance. If you don't receive a letter, please call our office to schedule the follow-up appointment.

## 2014-12-03 ENCOUNTER — Encounter (HOSPITAL_COMMUNITY): Payer: Self-pay | Admitting: Cardiology

## 2014-12-23 ENCOUNTER — Other Ambulatory Visit: Payer: Self-pay | Admitting: *Deleted

## 2014-12-23 MED ORDER — FUROSEMIDE 20 MG PO TABS: 20.0000 mg | ORAL_TABLET | ORAL | Status: DC

## 2014-12-23 NOTE — Telephone Encounter (Signed)
Patient calling to have refill resent to pharmacy. There was a misunderstanding and he has been taking the medcation everyday instead of every other day and the medication was only refilled for 30 days instead of the said 90.

## 2015-03-25 ENCOUNTER — Other Ambulatory Visit: Payer: Self-pay | Admitting: Cardiology

## 2015-04-26 ENCOUNTER — Encounter: Payer: Self-pay | Admitting: *Deleted

## 2015-04-26 ENCOUNTER — Encounter: Payer: Self-pay | Admitting: Cardiology

## 2015-04-26 ENCOUNTER — Other Ambulatory Visit: Payer: Self-pay | Admitting: *Deleted

## 2015-04-26 ENCOUNTER — Ambulatory Visit (INDEPENDENT_AMBULATORY_CARE_PROVIDER_SITE_OTHER): Payer: BLUE CROSS/BLUE SHIELD | Admitting: Cardiology

## 2015-04-26 VITALS — BP 106/72 | HR 60 | Ht 69.0 in | Wt 239.0 lb

## 2015-04-26 DIAGNOSIS — R55 Syncope and collapse: Secondary | ICD-10-CM

## 2015-04-26 DIAGNOSIS — I5022 Chronic systolic (congestive) heart failure: Secondary | ICD-10-CM

## 2015-04-26 DIAGNOSIS — E669 Obesity, unspecified: Secondary | ICD-10-CM

## 2015-04-26 DIAGNOSIS — I4892 Unspecified atrial flutter: Secondary | ICD-10-CM | POA: Diagnosis not present

## 2015-04-26 DIAGNOSIS — I429 Cardiomyopathy, unspecified: Secondary | ICD-10-CM | POA: Diagnosis not present

## 2015-04-26 DIAGNOSIS — Z79899 Other long term (current) drug therapy: Secondary | ICD-10-CM | POA: Diagnosis not present

## 2015-04-26 LAB — BASIC METABOLIC PANEL
BUN: 16 mg/dL (ref 6–23)
CO2: 25 mEq/L (ref 19–32)
Calcium: 8.8 mg/dL (ref 8.4–10.5)
Chloride: 102 mEq/L (ref 96–112)
Creatinine, Ser: 0.72 mg/dL (ref 0.40–1.50)
GFR: 116.82 mL/min (ref >60.00)
Glucose, Bld: 102 mg/dL — ABNORMAL HIGH (ref 70–99)
Potassium: 3.8 mEq/L (ref 3.5–5.1)
Sodium: 135 mEq/L (ref 135–145)

## 2015-04-26 LAB — CBC
HCT: 36.8 % — ABNORMAL LOW (ref 39.0–52.0)
Hemoglobin: 12.5 g/dL — ABNORMAL LOW (ref 13.0–17.0)
MCHC: 33.9 g/dL (ref 30.0–36.0)
MCV: 91.4 fl (ref 78.0–100.0)
Platelets: 180 10*3/uL (ref 150.0–400.0)
RBC: 4.02 Mil/uL — ABNORMAL LOW (ref 4.22–5.81)
RDW: 14 % (ref 11.5–15.5)
WBC: 14.5 10*3/uL — ABNORMAL HIGH (ref 4.0–10.5)

## 2015-04-26 MED ORDER — METOPROLOL SUCCINATE ER 25 MG PO TB24: ORAL_TABLET | ORAL | Status: DC

## 2015-04-26 MED ORDER — LISINOPRIL 5 MG PO TABS: ORAL_TABLET | ORAL | Status: DC

## 2015-04-26 MED ORDER — FUROSEMIDE 20 MG PO TABS: 20.0000 mg | ORAL_TABLET | ORAL | Status: DC | PRN

## 2015-04-26 NOTE — Progress Notes (Signed)
[address] [place], [place]  [address] Phone: [phone] Fax:  [phone]  Date:  04/26/2015   ID:  Jerome Kent, DOB Jun 14, 1951, MRN [id number]  PCP:  Allean Found, MD   History of Present Illness: Jerome Kent is a 64 y.o. male with obstructive sleep apnea on CPAP, hyperlipidemia, ED with previous atrial flutter, typical, negative in inferior leads with 4-1 conduction previously status post cardioversion on 9/14 with cardiomyopathy, ejection fraction of 15% noted during transesophageal echocardiogram, newly diagnosed.  On 09/22/13, I increased his metoprolol to 50 mg and started him on lisinopril 5 mg. Continued with Lasix. A repeat echocardiogram shows improvement in his EF but it is still in the 35% range.  10/23/13-this morning he feels a little "off ". Perhaps maybe mildly lightheaded. Didn't have much appetite this morning. No significant trouble breathing. No chest pain. On auscultation, appears to still be in normal rhythm. We discussed heart catheterization to exclude coronary artery disease today.  11/24/13-cardiac catheterization took place and showed no evidence of coronary artery disease. Ejection fraction seem improved to 40-45%. Blood pressure did not allow further up titration of beta blocker or ACE inhibitor. Lasix is every other day (although actually he has been taking it mostly daily.) Overall he is feeling better. Improved. No significant shortness of breath he states. No chest pain.  01/30/14-has been doing very well. I reviewed most recent echocardiogram from 2/15. EF remains at 40%. There is no clear evidence of apical thrombus on personally viewed. Nonetheless, remains on anticoagulation. He is maintaining sinus rhythm.  04/26/15-blood pressure has been quite low. He had a syncopal episode, after getting up off the couch. Orthostatic. Since losing approximately 50 pounds, his blood pressure has decreased significantly. We have decided  to stop some of his medications, diuretics that is and to take them on an as-needed basis. We have also cut back his metoprolol and lisinopril. Feeling run down.  Wt Readings from Last 3 Encounters:  04/26/15 239 lb (108.41 kg)  10/21/14 253 lb (114.76 kg)  05/27/14 285 lb (129.275 kg)     Past Medical History  Diagnosis Date  . Shortness of breath   . Sleep apnea   . Dysrhythmia     atrial flutter  . Hyperlipidemia   . Atrial flutter   . Obesity   . Cardiomyopathy   . Acute systolic heart failure   . OSA (obstructive sleep apnea)   . Erectile dysfunction   . Osteoarthritis   . Chronic systolic heart failure     EF 27% on TEE 9/14 during cardioversion    Past Surgical History  Procedure Laterality Date  . Total hip arthroplasty Right 2012  . Appendectomy    . Tee without cardioversion N/A 09/15/2013    Procedure: TRANSESOPHAGEAL ECHOCARDIOGRAM (TEE);  Surgeon: [name], MD;  Location: [facility] ENDOSCOPY;  Service: Cardiovascular;  Laterality: N/A;  . Cardioversion N/A 09/15/2013    Procedure: CARDIOVERSION;  Surgeon: [name], MD;  Location: [facility] ENDOSCOPY;  Service: Cardiovascular;  Laterality: N/A;  . Left heart catheterization with coronary angiogram N/A 10/29/2013    Procedure: LEFT HEART CATHETERIZATION WITH CORONARY ANGIOGRAM;  Surgeon: [name], MD;  Location: [facility] CATH LAB;  Service: Cardiovascular;  Laterality: N/A;    Current Outpatient Prescriptions  Medication Sig Dispense Refill  . amoxicillin (AMOXIL) 500 MG capsule Take 500 mg by mouth as needed. Before procedures for Dentist    . atorvastatin (LIPITOR) 40 MG tablet Take 1 tablet (40  mg total) by mouth daily. 90 tablet 1  . furosemide (LASIX) 20 MG tablet Take 1 tablet (20 mg total) by mouth every other day. 45 tablet 1  . lisinopril (PRINIVIL,ZESTRIL) 10 MG tablet TAKE 1 TABLET (10 MG TOTAL) BY MOUTH DAILY. 90 tablet 1  . metoprolol succinate (TOPROL-XL) 50 MG 24 hr tablet TAKE 1 TABLET (50 MG TOTAL) BY MOUTH  DAILY. 90 tablet 1  . spironolactone (ALDACTONE) 25 MG tablet Take 0.5 tablets (12.5 mg total) by mouth daily. 45 tablet 3  . XARELTO 20 MG TABS tablet TAKE 1 TABLET (20 MG TOTAL) BY MOUTH DAILY. 90 tablet 1   No current facility-administered medications for this visit.    Allergies:   No Known Allergies  Social History:  The patient  reports that he quit smoking about 20 years ago. He does not have any smokeless tobacco history on file. He reports that he drinks about 3.6 oz of alcohol per week. He reports that he does not use illicit drugs. [employer], [place], [employer]  ROS:  Please see the history of present illness.   No significant orthopnea, no chest pain. Improved shortness of breath. CPAP compliance.   All other systems reviewed and negative.   PHYSICAL EXAM: VS:  BP 106/72 mmHg  Pulse 60  Ht  (1.753 m)  Wt 239 lb (108.41 kg)  BMI 35.28 kg/m2 Well nourished, well developed, in no acute distress HEENT: normal Neck: no JVD Cardiac:  normal S1, S2; brady reg; no murmur Lungs:  clear to auscultation bilaterally, no wheezing, rhonchi or rales Abd: soft, nontender, no hepatomegalyObese Ext: no edema2+ radial pulse site Skin: warm and dry Neuro: no focal abnormalities noted  EKG:  10/07/13 :Sinus bradycardia at 50, T wave inversion, nonspecific in V3 through V5.  05/27/14-sinus bradycardia, 54    Catheterization 08/2013-no CAD TEE: 09/15/13 - EF 15% ECHO: 01/26/14- EF 35-40%  ASSESSMENT AND PLAN:  1. Near syncope-likely orthostatic from low blood pressure. Stopping diuretics, stopping furosemide 20 mg (can take this on an as-needed basis if weights increase 3-4 pounds), stopping spironolactone. You're also decreasing his lisinopril to 5 mg, Toprol to 25 mg. Check a basic metabolic profile and CBC. I will excuse him from work today. 2. Cardiomyopathy-NICM - no CAD - In part this was tachycardia induced likely. Original ejection fraction was 15%, his ejection  fraction remains in the 40% range. No prior history of stroke. Discussed previously with he and his wife that he is above the range of defibrillator placement.   3. Atrial flutter - doing well post cardioversion. Maintaining sinus rhythm/sinus bradycardia. We discussed possible electrophysiology evaluation if atrial flutter were to occur again. Cardioversion took place 09/15/13. He has maintained sinus rhythm. I am continuing with his anticoagulation because there is possibility that he may return to atrial flutter/fibrillation. He is fine with this. No bleeding, location. Checking CBC. 4. Obesity-weight loss is excellent. From 295-239. Wonderful. He states that it is mostly portion control. Eating a lot of salads. Discussed low carbohydrate diet 5. OSA - CPAP. 6. Chronic systolic heart failure-reassuring angiogram, nonischemic cardiomyopathy. He is on beta blocker, ACE inhibitor. Stopping spironolactone  (orthostatic). NYHA class I. 7. 2-4 week follow-up  Signed, [name], MD [facility]  04/26/2015 9:29 AM

## 2015-04-26 NOTE — Patient Instructions (Signed)
Medication Instructions:  Please stop your Spironolactone. Decrease Metoprolol to 25 mg a day. Decrease Lisinopril to 5 mg a day. Take Furosemide as needed for a 3 to 4 lb weight gain. Continue all other medications as listed.  Labwork: CBC, BMP today  Testing/Procedures: None  Follow-Up: Follow up in 2 to 4 weeks with Dr Anne Fu.  Please continue to weigh daily.   Please check blood pressures as home and let us know if they are elevated after medications changes.  Thank you for choosing [facility]!!

## 2015-04-27 NOTE — Progress Notes (Signed)
Called into pharmacy since EPIC not is sending - #[phone] X 3

## 2015-04-27 NOTE — Progress Notes (Signed)
Called into pharmacy since EPIC is not sending #[phone] X 11

## 2015-04-27 NOTE — Progress Notes (Signed)
Called into pharmacy since EPIC is not sending them  #[phone] X [phone]

## 2015-05-20 ENCOUNTER — Ambulatory Visit: Payer: BLUE CROSS/BLUE SHIELD | Admitting: Cardiology

## 2015-05-28 ENCOUNTER — Encounter: Payer: Self-pay | Admitting: Cardiology

## 2015-08-23 ENCOUNTER — Other Ambulatory Visit: Payer: Self-pay | Admitting: Cardiology

## 2015-09-17 ENCOUNTER — Other Ambulatory Visit: Payer: Self-pay | Admitting: Cardiology

## 2015-09-19 ENCOUNTER — Other Ambulatory Visit: Payer: Self-pay | Admitting: Cardiology

## 2015-10-04 ENCOUNTER — Other Ambulatory Visit: Payer: Self-pay | Admitting: Cardiology

## 2015-10-05 ENCOUNTER — Other Ambulatory Visit: Payer: Self-pay | Admitting: *Deleted

## 2015-10-05 MED ORDER — METOPROLOL SUCCINATE ER 25 MG PO TB24: ORAL_TABLET | ORAL | Status: DC

## 2015-10-08 ENCOUNTER — Other Ambulatory Visit: Payer: Self-pay | Admitting: Cardiology

## 2015-10-11 ENCOUNTER — Other Ambulatory Visit: Payer: Self-pay

## 2015-10-16 ENCOUNTER — Other Ambulatory Visit: Payer: Self-pay | Admitting: Cardiology

## 2015-10-20 ENCOUNTER — Other Ambulatory Visit: Payer: Self-pay | Admitting: Cardiology

## 2015-11-03 ENCOUNTER — Other Ambulatory Visit: Payer: Self-pay

## 2015-11-03 MED ORDER — LISINOPRIL 5 MG PO TABS: ORAL_TABLET | ORAL | Status: DC

## 2015-11-03 MED ORDER — METOPROLOL SUCCINATE ER 25 MG PO TB24: 25.0000 mg | ORAL_TABLET | Freq: Every day | ORAL | Status: DC

## 2015-11-24 ENCOUNTER — Ambulatory Visit (INDEPENDENT_AMBULATORY_CARE_PROVIDER_SITE_OTHER): Payer: BLUE CROSS/BLUE SHIELD | Admitting: Cardiology

## 2015-11-24 ENCOUNTER — Encounter: Payer: Self-pay | Admitting: Cardiology

## 2015-11-24 VITALS — BP 115/70 | HR 50 | Ht 69.0 in | Wt 247.0 lb

## 2015-11-24 DIAGNOSIS — I4892 Unspecified atrial flutter: Secondary | ICD-10-CM | POA: Diagnosis not present

## 2015-11-24 DIAGNOSIS — E669 Obesity, unspecified: Secondary | ICD-10-CM

## 2015-11-24 DIAGNOSIS — I429 Cardiomyopathy, unspecified: Secondary | ICD-10-CM

## 2015-11-24 MED ORDER — FUROSEMIDE 20 MG PO TABS: 20.0000 mg | ORAL_TABLET | Freq: Every day | ORAL | Status: DC | PRN

## 2015-11-24 MED ORDER — RIVAROXABAN 20 MG PO TABS: 20.0000 mg | ORAL_TABLET | Freq: Every day | ORAL | Status: DC

## 2015-11-24 MED ORDER — LISINOPRIL 5 MG PO TABS: 5.0000 mg | ORAL_TABLET | Freq: Every day | ORAL | Status: DC

## 2015-11-24 MED ORDER — METOPROLOL SUCCINATE ER 25 MG PO TB24: 25.0000 mg | ORAL_TABLET | Freq: Every day | ORAL | Status: DC

## 2015-11-24 MED ORDER — ATORVASTATIN CALCIUM 40 MG PO TABS: 40.0000 mg | ORAL_TABLET | Freq: Every day | ORAL | Status: DC

## 2015-11-24 NOTE — Progress Notes (Addendum)
[address] [place], [place]  [address] Phone: [phone] Fax:  [phone]  Date:  11/24/2015   ID:  Jerome Kent, DOB September 24, 1951, MRN [id number]  PCP:  Allean Found, MD   History of Present Illness: Jerome Kent is a 64 y.o. male with obstructive sleep apnea on CPAP, hyperlipidemia, ED with previous atrial flutter, typical, negative in inferior leads with 4-1 conduction previously status post cardioversion on 9/14 with cardiomyopathy, ejection fraction of 15% noted during transesophageal echocardiogram, newly diagnosed.  On 09/22/13, I increased his metoprolol to 50 mg and started him on lisinopril 5 mg. Continued with Lasix. A repeat echocardiogram shows improvement in his EF but it is still in the 35% range.  10/23/13-this morning he feels a little "off ". Perhaps maybe mildly lightheaded. Didn't have much appetite this morning. No significant trouble breathing. No chest pain. On auscultation, appears to still be in normal rhythm. We discussed heart catheterization to exclude coronary artery disease today.  11/24/13-cardiac catheterization took place and showed no evidence of coronary artery disease. Ejection fraction seem improved to 40-45%. Blood pressure did not allow further up titration of beta blocker or ACE inhibitor. Lasix is every other day (although actually he has been taking it mostly daily.) Overall he is feeling better. Improved. No significant shortness of breath he states. No chest pain.  01/30/14-has been doing very well. I reviewed most recent echocardiogram from 2/15. EF remains at 40%. There is no clear evidence of apical thrombus on personally viewed. Nonetheless, remains on anticoagulation. He is maintaining sinus rhythm.  04/26/15-blood pressure has been quite low. He had a syncopal episode, after getting up off the couch. Orthostatic. Since losing approximately 50 pounds, his blood pressure has decreased significantly. We have decided  to stop some of his medications, diuretics that is and to take them on an as-needed basis. We have also cut back his metoprolol and lisinopril. Feeling run down.  11/24/15-overall doing very well, no bleeding episodes other than minor urologic bleeding which was checked out by urology, antibiotics given, no syncope, no chest pain, no shortness of breath. His blood pressure has autoregulated. No further near syncopal episodes. Wife is present.  Wt Readings from Last 3 Encounters:  11/24/15 247 lb (112.038 kg)  04/26/15 239 lb (108.41 kg)  10/21/14 253 lb (114.76 kg)     Past Medical History  Diagnosis Date  . Shortness of breath   . Sleep apnea   . Dysrhythmia     atrial flutter  . Hyperlipidemia   . Atrial flutter (HCC)   . Obesity   . Cardiomyopathy (HCC)   . Acute systolic heart failure (HCC)   . OSA (obstructive sleep apnea)   . Erectile dysfunction   . Osteoarthritis   . Chronic systolic heart failure (HCC)     EF 15% on TEE 9/14 during cardioversion    Past Surgical History  Procedure Laterality Date  . Total hip arthroplasty Right 2012  . Appendectomy    . Tee without cardioversion N/A 09/15/2013    Procedure: TRANSESOPHAGEAL ECHOCARDIOGRAM (TEE);  Surgeon: [name], MD;  Location: [facility] ENDOSCOPY;  Service: Cardiovascular;  Laterality: N/A;  . Cardioversion N/A 09/15/2013    Procedure: CARDIOVERSION;  Surgeon: [name], MD;  Location[facility] ENDOSCOPY;  Service: Cardiovascular;  Laterality: N/A;  . Left heart catheterization with coronary angiogram N/A 10/29/2013    Procedure: LEFT HEART CATHETERIZATION WITH CORONARY ANGIOGRAM;  Surgeon: [name], MD;  Location: [facility] CATH LAB;  Service: Cardiovascular;  Laterality: N/A;    Current Outpatient Prescriptions  Medication Sig Dispense Refill  . atorvastatin (LIPITOR) 40 MG tablet Take 1 tablet (40 mg total) by mouth daily. 90 tablet 1  . furosemide (LASIX) 20 MG tablet TAKE 1 TABLET BY MOUTH DAILY AS NEEDED 14 tablet 0  .  lisinopril (PRINIVIL,ZESTRIL) 5 MG tablet TAKE 1 TABLET (5 MG TOTAL) BY MOUTH DAILY. 7 tablet 0  . metoprolol succinate (TOPROL-XL) 25 MG 24 hr tablet Take 1 tablet (25 mg total) by mouth daily. 30 tablet 0  . XARELTO 20 MG TABS tablet TAKE 1 TABLET BY MOUTH EVERY DAY (MAKE AN APPT.) 30 tablet 0  . amoxicillin (AMOXIL) 500 MG capsule Take 500 mg by mouth as needed. Before procedures for Dentist     No current facility-administered medications for this visit.    Allergies:   No Known Allergies  Social History:  The patient  reports that he quit smoking about 20 years ago. He does not have any smokeless tobacco history on file. He reports that he drinks about 3.6 oz of alcohol per week. He reports that he does not use illicit drugs. [employer] [place], [place], [employer]  ROS:  Please see the history of present illness.   No significant orthopnea, no chest pain. Improved shortness of breath. CPAP compliance.   All other systems reviewed and negative.   PHYSICAL EXAM: VS:  BP 115/70 mmHg  Pulse 50  Ht 5\' 9"  (1.753 m)  Wt 247 lb (112.038 kg)  BMI 36.46 kg/m2 Well nourished, well developed, in no acute distress HEENT: normal Neck: no JVD Cardiac:  normal S1, S2; brady reg; no murmur Lungs:  clear to auscultation bilaterally, no wheezing, rhonchi or rales Abd: soft, nontender, no hepatomegalyObese Ext: no edema2+ radial pulse site Skin: warm and dry Neuro: no focal abnormalities noted  EKG:  Today 11/24/15-sinus bradycardia rate 50 with no other abnormalities-no change from prior personally viewed 10/07/13 :Sinus bradycardia at 50, T wave inversion, nonspecific in V3 through V5.  05/27/14-sinus bradycardia, 54    Catheterization 08/2013-no CAD TEE: 09/15/13 - EF 15% ECHO: 01/26/14- EF 35-40%  ASSESSMENT AND PLAN:  1. Near syncope-seems to have resolved. He is not having any further episodes. Likely orthostatic from low blood pressure. Furosemide 20 mg (can take this on an  as-needed basis if weights increase 3-4 pounds), stopped spironolactone. Decreased his lisinopril to 5 mg, Toprol to 25 mg.  2. Cardiomyopathy-NICM - no CAD - In part this was tachycardia induced likely. Original ejection fraction was 15%, his ejection fraction remains in the 40% range. No prior history of stroke. Discussed previously with he and his wife that he is above the range of defibrillator placement.   3. Atrial flutter - doing well post cardioversion. Maintaining sinus rhythm/sinus bradycardia. We discussed possible electrophysiology evaluation if atrial flutter were to occur again. Cardioversion took place 09/15/13. He has maintained sinus rhythm. I am continuing with his anticoagulation because there is possibility that he may return to atrial flutter/fibrillation. He is fine with this. No bleeding, location.  4. Obesity-weight loss is excellent. From 295-239. Wonderful. He states that it is mostly portion control. Eating a lot of salads. Discussed low carbohydrate diet 5. OSA - CPAP. 6. Chronic systolic heart failure-reassuring angiogram, nonischemic cardiomyopathy. He is on beta blocker, ACE inhibitor. Stopped spironolactone  (orthostatic). NYHA class I. 7. 6 month follow-up  Signed, [name], MD [facility]  11/24/2015 8:40 AM

## 2015-11-24 NOTE — Patient Instructions (Addendum)
Medication Instructions:  Your physician recommends that you continue on your current medications as directed. Please refer to the Current Medication list given to you today.  Labwork: NONE  Testing/Procedures: NONE  Follow-Up: Your physician wants you to follow-up in: 6 months with Dr. Skains. You will receive a reminder letter in the mail two months in advance. If you don't receive a letter, please call our office to schedule the follow-up appointment.   If you need a refill on your cardiac medications before your next appointment, please call your pharmacy.    

## 2015-11-29 ENCOUNTER — Other Ambulatory Visit: Payer: Self-pay | Admitting: Cardiology

## 2016-05-24 ENCOUNTER — Other Ambulatory Visit: Payer: Self-pay | Admitting: Cardiology

## 2016-07-17 ENCOUNTER — Encounter: Payer: Self-pay | Admitting: Cardiology

## 2016-08-03 NOTE — Progress Notes (Signed)
[address] [place], [place]  [address] Phone: [phone] Fax:  [phone]  Date:  08/04/2016   ID:  Jerome Kent, DOB March 17, 1951, MRN [id number]  PCP:  Allean Found, MD   History of Present Illness: Jerome Kent is a 65 y.o. male with obstructive sleep apnea on CPAP, hyperlipidemia, ED with previous atrial flutter, typical, negative in inferior leads with 4-1 conduction previously status post cardioversion on 9/14 with cardiomyopathy, ejection fraction of 15% noted during transesophageal echocardiogram, newly diagnosed.  On 09/22/13, I increased his metoprolol to 50 mg and started him on lisinopril 5 mg. Continued with Lasix. A repeat echocardiogram shows improvement in his EF but it is still in the 35% range.  10/23/13-this morning he feels a little "off ". Perhaps maybe mildly lightheaded. Didn't have much appetite this morning. No significant trouble breathing. No chest pain. On auscultation, appears to still be in normal rhythm. We discussed heart catheterization to exclude coronary artery disease today.  11/24/13-cardiac catheterization took place and showed no evidence of coronary artery disease. Ejection fraction seem improved to 40-45%. Blood pressure did not allow further up titration of beta blocker or ACE inhibitor. Lasix is every other day (although actually he has been taking it mostly daily.) Overall he is feeling better. Improved. No significant shortness of breath he states. No chest pain.  01/30/14-has been doing very well. I reviewed most recent echocardiogram from 2/15. EF remains at 40%. There is no clear evidence of apical thrombus on personally viewed. Nonetheless, remains on anticoagulation. He is maintaining sinus rhythm.  04/26/15-blood pressure has been quite low. He had a syncopal episode, after getting up off the couch. Orthostatic. Since losing approximately 50 pounds, his blood pressure has decreased significantly. We have decided  to stop some of his medications, diuretics that is and to take them on an as-needed basis. We have also cut back his metoprolol and lisinopril. Feeling run down.  11/24/15-overall doing very well, no bleeding episodes other than minor urologic bleeding which was checked out by urology, antibiotics given, no syncope, no chest pain, no shortness of breath. His blood pressure has autoregulated. No further near syncopal episodes. Wife is present.  08/04/16 - Xarelto, blood in urine, sometimes clots noted. Low iron count. Iron pill. Urinary bleeding noted. No significant chest pain, shortness of breath. He has gained more weight. No prior urologic diagnosis however a few years ago he did have urinary obstruction the setting of urinary checked infection which was associated with bleeding prior to his Xarelto use.  Wt Readings from Last 3 Encounters:  08/04/16 258 lb (117 kg)  11/24/15 247 lb (112 kg)  04/26/15 239 lb (108.4 kg)     Past Medical History:  Diagnosis Date  . Acute systolic heart failure (HCC)   . Atrial flutter (HCC)   . Cardiomyopathy (HCC)   . Chronic systolic heart failure (HCC)    EF 15% on TEE 9/14 during cardioversion  . Dysrhythmia    atrial flutter  . Erectile dysfunction   . Hyperlipidemia   . Obesity   . OSA (obstructive sleep apnea)   . Osteoarthritis   . Shortness of breath   . Sleep apnea     Past Surgical History:  Procedure Laterality Date  . APPENDECTOMY    . CARDIOVERSION N/A 09/15/2013   Procedure: CARDIOVERSION;  Surgeon: [name], MD;  Location: [facility] ENDOSCOPY;  Service: Cardiovascular;  Laterality: N/A;  . LEFT HEART CATHETERIZATION WITH CORONARY ANGIOGRAM N/A 10/29/2013  Procedure: LEFT HEART CATHETERIZATION WITH CORONARY ANGIOGRAM;  Surgeon: [name], MD;  Location: [facility] CATH LAB;  Service: Cardiovascular;  Laterality: N/A;  . TEE WITHOUT CARDIOVERSION N/A 09/15/2013   Procedure: TRANSESOPHAGEAL ECHOCARDIOGRAM (TEE);  Surgeon: [name], MD;   Location: [facility] ENDOSCOPY;  Service: Cardiovascular;  Laterality: N/A;  . TOTAL HIP ARTHROPLASTY Right 2012    Current Outpatient Prescriptions  Medication Sig Dispense Refill  . amoxicillin (AMOXIL) 500 MG capsule Take 500 mg by mouth as needed. Before procedures for Dentist    . atorvastatin (LIPITOR) 40 MG tablet Take 1 tablet (40 mg total) by mouth daily. 90 tablet 3  . furosemide (LASIX) 20 MG tablet Take 20 mg by mouth daily.    [name] Kitchen lisinopril (PRINIVIL,ZESTRIL) 5 MG tablet Take 1 tablet (5 mg total) by mouth daily. Please call and schedule a 6 month follow up appointment per last office visit 90 tablet 1  . metoprolol succinate (TOPROL-XL) 25 MG 24 hr tablet Take 1 tablet (25 mg total) by mouth daily. 30 tablet 11   No current facility-administered medications for this visit.     Allergies:   No Known Allergies  Social History:  The patient  reports that he quit smoking about 21 years ago. He has never used smokeless tobacco. He reports that he drinks about 3.6 oz of alcohol per week . He reports that he does not use drugs. [employer] [place] [place]  ROS:  Please see the history of present illness.   No significant orthopnea, no chest pain. Improved shortness of breath. CPAP compliance.   All other systems reviewed and negative.   PHYSICAL EXAM: VS:  BP 112/66   Pulse (!) 58   Ht 5\' 9"  (1.753 m)   Wt 258 lb (117 kg)   BMI 38.10 kg/m  Well nourished, well developed, in no acute distress  HEENT: normal  Neck: no JVD  Cardiac:  normal S1, S2; brady reg; no murmur  Lungs:  clear to auscultation bilaterally, no wheezing, rhonchi or rales  Abd: soft, nontender, no hepatomegaly Obese Ext: no edema 2+ radial pulse site Skin: warm and dry  Neuro: no focal abnormalities noted  EKG:  Today 08/04/16-sinus bradycardia rate 53 with no other abnormalities. 11/24/15-sinus bradycardia rate 50 with no other abnormalities-no change from prior personally viewed 10/07/13  :Sinus bradycardia at 50, T wave inversion, nonspecific in V3 through V5.  05/27/14-sinus bradycardia, 54    Catheterization 08/2013-no CAD TEE: 09/15/13 - EF 15% ECHO: 01/26/14- EF 35-40%  ASSESSMENT AND PLAN:  1. Near syncope- resolved. He is not having any further episodes. Likely orthostatic from low blood pressure. Furosemide 20 mg (can take this on an as-needed basis if weights increase 3-4 pounds), stopped spironolactone. Decreased his lisinopril to 5 mg, Toprol to 25 mg.  2. Cardiomyopathy-NICM - no CAD - In part this was tachycardia induced likely. Original ejection fraction was 15%, now his ejection fraction remains in the 40% range. No prior history of stroke. Discussed previously with he and his wife that he is above the range of defibrillator placement.   3. Atrial flutter - doing well post cardioversion. Maintaining sinus rhythm/sinus bradycardia. Has not had any further bouts of atrial flutter area We discussed possible electrophysiology evaluation if atrial flutter were to occur again. Cardioversion took place 09/15/13. He has maintained sinus rhythm. Given his recent increase in hematuria which I suggested he visit urology, Dr. Vernie Ammons, in the fact that he has not demonstrated any further  arrhythmias in 3 years, we will discontinue his Xarelto. Of course if atrial arrhythmia returns we will need to restart. I want to make sure that he does not have any underlying neurologic condition that is leading to his bleeding.  4. Obesity-once again encouraged weight loss.  Eating a lot of salads. Discussed low carbohydrate diet 5. OSA - CPAP. 6. Chronic systolic heart failure-reassuring angiogram, nonischemic cardiomyopathy. He is on beta blocker, ACE inhibitor. Stopped spironolactone  (orthostatic). NYHA class I. 7. 6 month follow-up APP, 12 months me  Signed, [name], MD [facility]  08/04/2016 9:02 AM

## 2016-08-04 ENCOUNTER — Ambulatory Visit (INDEPENDENT_AMBULATORY_CARE_PROVIDER_SITE_OTHER): Payer: PPO | Admitting: Cardiology

## 2016-08-04 ENCOUNTER — Encounter: Payer: Self-pay | Admitting: Cardiology

## 2016-08-04 VITALS — BP 112/66 | HR 58 | Ht 69.0 in | Wt 258.0 lb

## 2016-08-04 DIAGNOSIS — Z79899 Other long term (current) drug therapy: Secondary | ICD-10-CM

## 2016-08-04 DIAGNOSIS — I5022 Chronic systolic (congestive) heart failure: Secondary | ICD-10-CM | POA: Diagnosis not present

## 2016-08-04 DIAGNOSIS — I4892 Unspecified atrial flutter: Secondary | ICD-10-CM | POA: Diagnosis not present

## 2016-08-04 DIAGNOSIS — I429 Cardiomyopathy, unspecified: Secondary | ICD-10-CM | POA: Diagnosis not present

## 2016-08-04 DIAGNOSIS — E669 Obesity, unspecified: Secondary | ICD-10-CM

## 2016-08-04 NOTE — Patient Instructions (Signed)
Medication Instructions:  Please stop your Xarelto. Continue all other medications as listed.  Follow-Up: Follow up in 6 months with Norma Fredrickson, NP.  You will receive a letter in the mail 2 months before you are due.  Please call us when you receive this letter to schedule your follow up appointment.  Follow up in 1 year with Dr. Anne Fu.  You will receive a letter in the mail 2 months before you are due.  Please call us when you receive this letter to schedule your follow up appointment.  If you need a refill on your cardiac medications before your next appointment, please call your pharmacy.  Thank you for choosing [facility]!!

## 2016-09-07 DIAGNOSIS — M9905 Segmental and somatic dysfunction of pelvic region: Secondary | ICD-10-CM | POA: Diagnosis not present

## 2016-09-07 DIAGNOSIS — M9902 Segmental and somatic dysfunction of thoracic region: Secondary | ICD-10-CM | POA: Diagnosis not present

## 2016-09-07 DIAGNOSIS — M5135 Other intervertebral disc degeneration, thoracolumbar region: Secondary | ICD-10-CM | POA: Diagnosis not present

## 2016-09-07 DIAGNOSIS — M9903 Segmental and somatic dysfunction of lumbar region: Secondary | ICD-10-CM | POA: Diagnosis not present

## 2016-09-07 DIAGNOSIS — M5136 Other intervertebral disc degeneration, lumbar region: Secondary | ICD-10-CM | POA: Diagnosis not present

## 2016-09-11 DIAGNOSIS — M5135 Other intervertebral disc degeneration, thoracolumbar region: Secondary | ICD-10-CM | POA: Diagnosis not present

## 2016-09-11 DIAGNOSIS — M9903 Segmental and somatic dysfunction of lumbar region: Secondary | ICD-10-CM | POA: Diagnosis not present

## 2016-09-11 DIAGNOSIS — M9902 Segmental and somatic dysfunction of thoracic region: Secondary | ICD-10-CM | POA: Diagnosis not present

## 2016-09-11 DIAGNOSIS — M5136 Other intervertebral disc degeneration, lumbar region: Secondary | ICD-10-CM | POA: Diagnosis not present

## 2016-09-11 DIAGNOSIS — M9905 Segmental and somatic dysfunction of pelvic region: Secondary | ICD-10-CM | POA: Diagnosis not present

## 2016-09-13 ENCOUNTER — Encounter: Payer: Self-pay | Admitting: *Deleted

## 2016-09-14 DIAGNOSIS — M9902 Segmental and somatic dysfunction of thoracic region: Secondary | ICD-10-CM | POA: Diagnosis not present

## 2016-09-14 DIAGNOSIS — M5135 Other intervertebral disc degeneration, thoracolumbar region: Secondary | ICD-10-CM | POA: Diagnosis not present

## 2016-09-14 DIAGNOSIS — M9903 Segmental and somatic dysfunction of lumbar region: Secondary | ICD-10-CM | POA: Diagnosis not present

## 2016-09-14 DIAGNOSIS — M9905 Segmental and somatic dysfunction of pelvic region: Secondary | ICD-10-CM | POA: Diagnosis not present

## 2016-09-14 DIAGNOSIS — M5136 Other intervertebral disc degeneration, lumbar region: Secondary | ICD-10-CM | POA: Diagnosis not present

## 2016-09-18 DIAGNOSIS — M5135 Other intervertebral disc degeneration, thoracolumbar region: Secondary | ICD-10-CM | POA: Diagnosis not present

## 2016-09-18 DIAGNOSIS — M9903 Segmental and somatic dysfunction of lumbar region: Secondary | ICD-10-CM | POA: Diagnosis not present

## 2016-09-18 DIAGNOSIS — M9905 Segmental and somatic dysfunction of pelvic region: Secondary | ICD-10-CM | POA: Diagnosis not present

## 2016-09-18 DIAGNOSIS — M5136 Other intervertebral disc degeneration, lumbar region: Secondary | ICD-10-CM | POA: Diagnosis not present

## 2016-09-18 DIAGNOSIS — M9902 Segmental and somatic dysfunction of thoracic region: Secondary | ICD-10-CM | POA: Diagnosis not present

## 2016-09-21 ENCOUNTER — Other Ambulatory Visit: Payer: Self-pay | Admitting: Cardiology

## 2016-11-19 ENCOUNTER — Other Ambulatory Visit: Payer: Self-pay | Admitting: Cardiology

## 2016-12-02 ENCOUNTER — Other Ambulatory Visit: Payer: Self-pay | Admitting: Cardiology

## 2016-12-05 ENCOUNTER — Other Ambulatory Visit: Payer: Self-pay | Admitting: Cardiology

## 2017-01-08 ENCOUNTER — Encounter: Payer: Self-pay | Admitting: Nurse Practitioner

## 2017-01-08 ENCOUNTER — Telehealth: Payer: Self-pay | Admitting: Cardiology

## 2017-01-08 ENCOUNTER — Encounter (INDEPENDENT_AMBULATORY_CARE_PROVIDER_SITE_OTHER): Payer: Self-pay

## 2017-01-08 ENCOUNTER — Ambulatory Visit (INDEPENDENT_AMBULATORY_CARE_PROVIDER_SITE_OTHER): Payer: PPO | Admitting: Nurse Practitioner

## 2017-01-08 VITALS — BP 98/80 | HR 94 | Ht 69.0 in | Wt 275.6 lb

## 2017-01-08 DIAGNOSIS — I5022 Chronic systolic (congestive) heart failure: Secondary | ICD-10-CM

## 2017-01-08 DIAGNOSIS — I4892 Unspecified atrial flutter: Secondary | ICD-10-CM

## 2017-01-08 MED ORDER — RIVAROXABAN 20 MG PO TABS: 20.0000 mg | ORAL_TABLET | Freq: Every day | ORAL | 6 refills | Status: DC

## 2017-01-08 NOTE — Patient Instructions (Addendum)
We will be checking the following labs today - BMET, CBC, PT, PTT   Medication Instructions:    Continue with your current medicines. BUT  We are restarting your Xarelto - 20 mg to take one a day with your largest meal of the day.  Take today's dose when you get home please.     Testing/Procedures To Be Arranged:  TEE cardioversion for the end of the week - Thursday/Friday  Follow-Up:   EP referral for consideration of ablation with Dr. Elberta Fortis.     Other Special Instructions:   I am putting you out of work for this week.    Your provider has recommended a TEE cardioversion.   You are scheduled for a cardioversion on Thursday, January 18th at 10 AM with Dr. Anne Fu or associates. Please go to [facility] 2nd Floor Short Stay at Thursday, January 18th at 8:30AM.  Enter through the [employer] A Do not have any food or drink after midnight on Wednesday.  You may take your medicines with a sip of water on the day of your procedure.  You will need someone to drive you home following your procedure.   Call the [facility] office at [phone] if you have any questions, problems or concerns.     Electrical Cardioversion Electrical cardioversion is the delivery of a jolt of electricity to change the rhythm of the heart. Sticky patches or metal paddles are placed on the chest to deliver the electricity from a device. This is done to restore a normal rhythm. A rhythm that is too fast or not regular keeps the heart from pumping well. Electrical cardioversion is done in an emergency if:   There is low or no blood pressure as a result of the heart rhythm.   Normal rhythm must be restored as fast as possible to protect the brain and heart from further damage.   It may save a life. Cardioversion may be done for heart rhythms that are not immediately life threatening, such as atrial fibrillation or flutter, in which:   The heart is beating  too fast or is not regular.   Medicine to change the rhythm has not worked.   It is safe to wait in order to allow time for preparation.  Symptoms of the abnormal rhythm are bothersome.  The risk of stroke and other serious problems can be reduced.  LET [facility] CARE PROVIDER KNOW ABOUT:   Any allergies you have.  All medicines you are taking, including vitamins, herbs, eye drops, creams, and over-the-counter medicines.  Previous problems you or members of your family have had with the use of anesthetics.   Any blood disorders you have.   Previous surgeries you have had.   Medical conditions you have.   RISKS AND COMPLICATIONS  Generally, this is a safe procedure. However, problems can occur and include:   Breathing problems related to the anesthetic used.  A blood clot that breaks free and travels to other parts of your body. This could cause a stroke or other problems. The risk of this is lowered by use of blood-thinning medicine (anticoagulant) prior to the procedure.  Cardiac arrest (rare).   BEFORE THE PROCEDURE   You may have tests to detect blood clots in your heart and to evaluate heart function.  You may start taking anticoagulants so your blood does not clot as easily.   Medicines may be given to help stabilize your heart rate and rhythm.  PROCEDURE  You will be given medicine through an IV tube to reduce discomfort and make you sleepy (sedative).   An electrical shock will be delivered.   AFTER THE PROCEDURE Your heart rhythm will be watched to make sure it does not change. You will need someone to drive you home.       If you need a refill on your cardiac medications before your next appointment, please call your pharmacy.   Call the [facility] office at [phone] if you have any questions, problems or concerns.

## 2017-01-08 NOTE — Telephone Encounter (Signed)
Received call back from patient.He stated he has been dizzy and sob.Stated he feels like he did when his heart was out of rhythm.Appointment scheduled with Norma Fredrickson NP this morning at 9:30 am.

## 2017-01-08 NOTE — Telephone Encounter (Signed)
° °  1. Are you currently SOB (can you hear that pt is SOB on the phone)?  cannot hear it over the phone 2. How long have you been experiencing SOB? Since Sunday, 01-07-17  3. Are you SOB when sitting or when up moving around? Minimal activiey  4. Are you currently experiencing any other symptoms? dizziness

## 2017-01-08 NOTE — Telephone Encounter (Signed)
Returned call to patient no answer.LMTC. 

## 2017-01-08 NOTE — Progress Notes (Signed)
CARDIOLOGY OFFICE NOTE  Date:  01/08/2017    Jerome Kent Date of Birth: [birth year] Medical Record #[id number]  PCP:  Allean Found, MD  Cardiologist:  [facility]  Chief Complaint  Patient presents with  . Atrial Fibrillation    Work in visit - seen for Dr. Anne Fu    History of Present Illness: Jerome Kent is a 66 y.o. male who presents today for a work in visit. Seen for Dr. Anne Fu.   He has a history of obstructive sleep apnea on CPAP, hyperlipidemia, prior atrial flutter, typical, negative in inferior leads with 4-1 conduction previously status post cardioversion on 9/14 with cardiomyopathy, prior ejection fraction of 15% noted during transesophageal echocardiogram. No CAD per prior cardiac cath from 2014. Looks like medicines cut back back in 2016 after weight loss. Has had Xarelto stopped due to hematuria back in 2017.   Phone call this morning - Received call back from patient.He stated he has been dizzy and sob.Stated he feels like he did when his heart was out of rhythm. Appointment scheduled with Norma Fredrickson NP this morning at 9:30 am  Thus added to my schedule.   Comes in today. Here with his fiance. Has felt bad since yesterday. Got up and felt dizzy. Got worse during the course of the day. Had to stop and basically sit due to dizziness and dyspnea. Jittery this AM. Every time he gets up - he gets dizzy and its hard to breath. Tried to go to work this morning but felt too bad. No longer on his Xarelto due to hematuria - this was stopped back in August. Not on aspirin therapy as well. His hematuria has stopped. He says he was seen by Dr. Vernie Ammons and no etiology for his hematuria. He has OSA but has not been wearing his mask. He has gained his weight back.    Past Medical History:  Diagnosis Date  . Acute systolic heart failure (HCC)   . Atrial flutter (HCC)   . Cardiomyopathy (HCC)   . Chronic systolic heart failure (HCC)    EF 15% on TEE 9/14 during  cardioversion  . Dysrhythmia    atrial flutter  . Erectile dysfunction   . Hyperlipidemia   . Obesity   . OSA (obstructive sleep apnea)   . Osteoarthritis   . Shortness of breath   . Sleep apnea     Past Surgical History:  Procedure Laterality Date  . APPENDECTOMY    . CARDIOVERSION N/A 09/15/2013   Procedure: CARDIOVERSION;  Surgeon: [name], MD;  Location: [facility] ENDOSCOPY;  Service: Cardiovascular;  Laterality: N/A;  . LEFT HEART CATHETERIZATION WITH CORONARY ANGIOGRAM N/A 10/29/2013   Procedure: LEFT HEART CATHETERIZATION WITH CORONARY ANGIOGRAM;  Surgeon: [name], MD;  Location: [facility] CATH LAB;  Service: Cardiovascular;  Laterality: N/A;  . TEE WITHOUT CARDIOVERSION N/A 09/15/2013   Procedure: TRANSESOPHAGEAL ECHOCARDIOGRAM (TEE);  Surgeon: [name], MD;  Location: [facility] ENDOSCOPY;  Service: Cardiovascular;  Laterality: N/A;  . TOTAL HIP ARTHROPLASTY Right 2012     Medications: Current Outpatient Prescriptions  Medication Sig Dispense Refill  . amoxicillin (AMOXIL) 500 MG capsule Take 500 mg by mouth as needed. Before procedures for Dentist    . atorvastatin (LIPITOR) 40 MG tablet Take 1 tablet (40 mg total) by mouth daily. 90 tablet 3  . furosemide (LASIX) 20 MG tablet Take 20 mg by mouth daily.    [name] Kitchen lisinopril (PRINIVIL,ZESTRIL) 5 MG tablet TAKE 1 TABLET BY MOUTH EVERY DAY (PLEASE CALL  AND SCHEDULE 6 MONTH FOLLOW UP APPOINTMENT) 90 tablet 2  . metoprolol succinate (TOPROL-XL) 25 MG 24 hr tablet Take 1 tablet (25 mg total) by mouth daily. 30 tablet 11  . rivaroxaban (XARELTO) 20 MG TABS tablet Take 1 tablet (20 mg total) by mouth daily with supper. 30 tablet 6   No current facility-administered medications for this visit.     Allergies: No Known Allergies  Social History: The patient  reports that he quit smoking about 22 years ago. He has never used smokeless tobacco. He reports that he drinks about 3.6 oz of alcohol per week . He reports that he does not use drugs.     Family History: The patient's family history includes Cancer in his father; Diabetes in his brother and father; Hypertension in his mother; Stroke in his father.   Review of Systems: Please see the history of present illness.   Otherwise, the review of systems is positive for none.   All other systems are reviewed and negative.   Physical Exam: VS:  BP 98/80   Pulse 94   Ht 5\' 9"  (1.753 m)   Wt 275 lb 9.6 oz (125 kg)   BMI 40.70 kg/m  .  BMI Body mass index is 40.7 kg/m.  Wt Readings from Last 3 Encounters:  01/08/17 275 lb 9.6 oz (125 kg)  08/04/16 258 lb (117 kg)  11/24/15 247 lb (112 kg)    General: Pleasant. Morbidly obese but alert and in no acute distress.  He has gained weight.  HEENT: Normal.  Neck: Supple, no JVD, carotid bruits, or masses noted.  Cardiac: Irregular irregular rhythm. Rate is fair. No murmurs, rubs, or gallops. No edema.  Respiratory:  Lungs are clear to auscultation bilaterally with normal work of breathing.  GI: Soft and nontender. Obese.  MS: No deformity or atrophy. Gait and ROM intact.  Skin: Warm and dry. Color is normal.  Neuro:  Strength and sensation are intact and no gross focal deficits noted.  Psych: Alert, appropriate and with normal affect.   LABORATORY DATA:  EKG:  EKG is ordered today. This demonstrates atrial with variable AV block. Rate of 94. Reviewed with Dr. Elberta Fortis.   Lab Results  Component Value Date   WBC 14.5 (H) 04/26/2015   HGB 12.5 (L) 04/26/2015   HCT 36.8 (L) 04/26/2015   PLT 180.0 04/26/2015   GLUCOSE 102 (H) 04/26/2015   NA 135 04/26/2015   K 3.8 04/26/2015   CL 102 04/26/2015   CREATININE 0.72 04/26/2015   BUN 16 04/26/2015   CO2 25 04/26/2015   INR 1.6 (H) 10/23/2013    BNP (last 3 results) No results for input(s): BNP in the last 8760 hours.  ProBNP (last 3 results) No results for input(s): PROBNP in the last 8760 hours.   Other Studies Reviewed Today:  Echo Study Conclusions from 01/2014 -  Left ventricle: The cavity size was normal. Wall thickness was increased in a pattern of mild LVH. Systolic function was moderately reduced. The estimated ejection fraction was in the range of 35% to 40%. Diffuse hypokinesis. Doppler parameters are consistent with abnormal left ventricular relaxation (grade 1 diastolic dysfunction). There was a structure at the apex that was concerning for thrombus/mass in a couple of views but after reviewing entire study is likely trabeculation. - Aortic valve: There was no stenosis. - Mitral valve: Trivial regurgitation. - Left atrium: The atrium was mildly dilated. - Right ventricle: The cavity size was normal. Systolic function was  mildly reduced. - Right atrium: The atrium was mildly dilated. - Pulmonary arteries: No complete TR doppler jet so unable to estimate PA systolic pressure. - Inferior vena cava: The vessel was normal in size; the respirophasic diameter changes were in the normal range (= 50%); findings are consistent with normal central venous pressure. Impressions:  - Normal LV size with mild LV hypertrophy. EF 35-40% with diffuse hypokinesis. Normal RV size with mildly decreased systolic function. No significant valvular abnormalities.  CARDIAC CATHETERIZATION 10/2013  PROCEDURE:  Left heart catheterization with selective coronary angiography, left ventriculogram via the radial artery approach.  INDICATIONS:  66 year old male with newly diagnosed cardiomyopathy, acute systolic heart failure possible viral, possible tachycardia induced after atrial flutter/fibrillation was noted. This catheterization is being done to exclude the possibility of coronary artery disease as reason for cardiomyopathy. Repeat echocardiogram still demonstrated reduced ejection fraction in the 35% to 40% range. Original EF was approximately 15-20%. This EF was performed in the setting of transesophageal echocardiogram for atrial  flutter cardioversion.  ANGIOGRAPHIC DATA:    Left main: No angiographically significant coronary artery disease.  Left anterior descending (LAD): No angiographically significant coronary artery disease  Circumflex artery (CIRC): No angiographically significant coronary artery disease.  Right coronary artery (RCA): No angiographically significant coronary artery disease. Dominant vessel.  LEFT VENTRICULOGRAM:  Left ventricular angiogram was done in the 30 RAO projection and revealed moderately reduced left ventricular ejection fraction, EF in the 40-45% range. Post-PVC noted. Global hypokinesis.  IMPRESSIONS:  1. No angiographically significant CAD 2. Moderately reduced left ventricular systolic function.  LVEDP 12 mmHg.  Ejection fraction 40-45 %.  RECOMMENDATION:  EF has improved. We will continue with medical therapy. Blood pressure does not allow for further up titration of beta blocker or ACE inhibitor. I will as come to take his Lasix now on an every other day basis given blood pressure.    Assessment/Plan:  1. Recurrent atrial flutter - rate fair. Past cardioversion from 2014. Discussed with Dr. Elberta Fortis (DOD). Will add back Xarelto 20 mg a day. Lab today. Arrange TEE cardioversion for the end of this week after 3 doses of Xarelto - scheduled with Dr. Anne Fu for Thursday. Will arrange EP referral to consider ablation. The procedure/risks/benefits have been discussed and he is anxious to proceed.   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 I suspect current symptoms are related to being back out of rhythm. He does not look to be in overt heart failure.   3. Obesity  4. OSA - not using his CPAP  Current medicines are reviewed with the patient today.  The patient does not have concerns regarding medicines other than what has been noted above.  The following changes have been made:  See above.  Labs/ tests ordered today  include:    Orders Placed This Encounter  Procedures  . Basic metabolic panel  . CBC  . Protime-INR  . APTT  . Ambulatory referral to Cardiac Electrophysiology  . EKG 12-Lead     Disposition:   Further disposition to follow.   Patient is agreeable to this plan and will call if any problems develop in the interim.   Signed: Rosalio Macadamia, RN, ANP-C 01/08/2017 10:07 AM  [facility] [address] [place], [place]  [address] Phone: [phone] Fax: [phone]

## 2017-01-09 ENCOUNTER — Telehealth: Payer: Self-pay | Admitting: Cardiology

## 2017-01-09 LAB — BASIC METABOLIC PANEL
BUN/Creatinine Ratio: 16 (ref 10–24)
BUN: 15 mg/dL (ref 8–27)
CO2: 24 mmol/L (ref 18–29)
Calcium: 9 mg/dL (ref 8.6–10.2)
Chloride: 101 mmol/L (ref 96–106)
Creatinine, Ser: 0.95 mg/dL (ref 0.76–1.27)
GFR calc Af Amer: 97 mL/min/{1.73_m2} (ref >59)
GFR calc non Af Amer: 84 mL/min/{1.73_m2} (ref >59)
Glucose: 93 mg/dL (ref 65–99)
Potassium: 4.8 mmol/L (ref 3.5–5.2)
Sodium: 142 mmol/L (ref 134–144)

## 2017-01-09 LAB — CBC
Hematocrit: 45.6 % (ref 37.5–51.0)
Hemoglobin: 15.3 g/dL (ref 13.0–17.7)
MCH: 31 pg (ref 26.6–33.0)
MCHC: 33.6 g/dL (ref 31.5–35.7)
MCV: 93 fL (ref 79–97)
Platelets: 215 10*3/uL (ref 150–379)
RBC: 4.93 x10E6/uL (ref 4.14–5.80)
RDW: 14.4 % (ref 12.3–15.4)
WBC: 7.5 10*3/uL (ref 3.4–10.8)

## 2017-01-09 LAB — PROTIME-INR
INR: 1 (ref 0.8–1.2)
Prothrombin Time: 10.3 s (ref 9.1–12.0)

## 2017-01-09 LAB — APTT: aPTT: 24 s (ref 24–33)

## 2017-01-09 NOTE — Telephone Encounter (Signed)
New message  ° ° °Pt verbalized that he is returning call for rn °

## 2017-01-11 ENCOUNTER — Ambulatory Visit (HOSPITAL_COMMUNITY): Payer: PPO | Admitting: Certified Registered Nurse Anesthetist

## 2017-01-11 ENCOUNTER — Encounter (HOSPITAL_COMMUNITY)
Admission: RE | Disposition: A | Discharge status: Home / Self Care | Payer: Self-pay | Source: Ambulatory Visit | Attending: Cardiology

## 2017-01-11 ENCOUNTER — Ambulatory Visit (HOSPITAL_BASED_OUTPATIENT_CLINIC_OR_DEPARTMENT_OTHER): Payer: PPO

## 2017-01-11 ENCOUNTER — Ambulatory Visit (HOSPITAL_COMMUNITY)
Admission: RE | Admit: 2017-01-11 | Discharge: 2017-01-11 | Disposition: A | Discharge status: Home / Self Care | Payer: PPO | Source: Ambulatory Visit | Attending: Cardiology | Admitting: Cardiology

## 2017-01-11 ENCOUNTER — Encounter (HOSPITAL_COMMUNITY): Payer: Self-pay | Admitting: Certified Registered Nurse Anesthetist

## 2017-01-11 DIAGNOSIS — I110 Hypertensive heart disease with heart failure: Secondary | ICD-10-CM | POA: Insufficient documentation

## 2017-01-11 DIAGNOSIS — G4733 Obstructive sleep apnea (adult) (pediatric): Secondary | ICD-10-CM | POA: Insufficient documentation

## 2017-01-11 DIAGNOSIS — Z87891 Personal history of nicotine dependence: Secondary | ICD-10-CM | POA: Insufficient documentation

## 2017-01-11 DIAGNOSIS — I483 Typical atrial flutter: Secondary | ICD-10-CM | POA: Insufficient documentation

## 2017-01-11 DIAGNOSIS — I428 Other cardiomyopathies: Secondary | ICD-10-CM | POA: Diagnosis not present

## 2017-01-11 DIAGNOSIS — I429 Cardiomyopathy, unspecified: Secondary | ICD-10-CM | POA: Diagnosis not present

## 2017-01-11 DIAGNOSIS — I10 Essential (primary) hypertension: Secondary | ICD-10-CM | POA: Insufficient documentation

## 2017-01-11 DIAGNOSIS — Z96641 Presence of right artificial hip joint: Secondary | ICD-10-CM | POA: Diagnosis not present

## 2017-01-11 DIAGNOSIS — I4892 Unspecified atrial flutter: Secondary | ICD-10-CM

## 2017-01-11 DIAGNOSIS — Z79899 Other long term (current) drug therapy: Secondary | ICD-10-CM | POA: Diagnosis not present

## 2017-01-11 DIAGNOSIS — I5022 Chronic systolic (congestive) heart failure: Secondary | ICD-10-CM | POA: Diagnosis not present

## 2017-01-11 DIAGNOSIS — E785 Hyperlipidemia, unspecified: Secondary | ICD-10-CM | POA: Insufficient documentation

## 2017-01-11 DIAGNOSIS — Z6841 Body Mass Index (BMI) 40.0 and over, adult: Secondary | ICD-10-CM | POA: Insufficient documentation

## 2017-01-11 HISTORY — PX: CARDIOVERSION: SHX1299

## 2017-01-11 HISTORY — PX: TEE WITHOUT CARDIOVERSION: SHX5443

## 2017-01-11 SURGERY — CARDIOVERSION
Anesthesia: General

## 2017-01-11 MED ORDER — PHENYLEPHRINE HCL 10 MG/ML IJ SOLN
INTRAMUSCULAR | Status: DC | PRN
  Administered 2017-01-11: 40 ug via INTRAVENOUS
  Administered 2017-01-11: 80 ug via INTRAVENOUS
  Administered 2017-01-11 (×3): 40 ug via INTRAVENOUS
  Administered 2017-01-11: 100 ug via INTRAVENOUS
  Administered 2017-01-11: 40 ug via INTRAVENOUS
  Administered 2017-01-11: 100 ug via INTRAVENOUS
  Administered 2017-01-11 (×2): 40 ug via INTRAVENOUS

## 2017-01-11 MED ORDER — PROPOFOL 500 MG/50ML IV EMUL
INTRAVENOUS | Status: DC | PRN
  Administered 2017-01-11: 75 ug/kg/min via INTRAVENOUS

## 2017-01-11 MED ORDER — PROPOFOL 10 MG/ML IV BOLUS
INTRAVENOUS | Status: DC | PRN
  Administered 2017-01-11: 20 mg via INTRAVENOUS

## 2017-01-11 MED ORDER — SODIUM CHLORIDE 0.9 % IV SOLN: INTRAVENOUS | Status: DC

## 2017-01-11 MED ORDER — LACTATED RINGERS IV SOLN
INTRAVENOUS | Status: DC
  Administered 2017-01-11 (×2): via INTRAVENOUS

## 2017-01-11 NOTE — Anesthesia Preprocedure Evaluation (Signed)
Anesthesia Evaluation  Patient identified by MRN, date of birth, ID band Patient awake    Reviewed: Allergy & Precautions, NPO status , Patient's Chart, lab work & pertinent test results  Airway Mallampati: I       Dental no notable dental hx.    Pulmonary former smoker,    Pulmonary exam normal        Cardiovascular hypertension, Pt. on home beta blockers Normal cardiovascular exam     Neuro/Psych negative neurological ROS  negative psych ROS   GI/Hepatic negative GI ROS, Neg liver ROS,   Endo/Other  Morbid obesity  Renal/GU negative Renal ROS  negative genitourinary   Musculoskeletal   Abdominal (+) + obese,   Peds negative pediatric ROS (+)  Hematology negative hematology ROS (+)   Anesthesia Other Findings   Reproductive/Obstetrics negative OB ROS                             Anesthesia Physical Anesthesia Plan  ASA: III  Anesthesia Plan: General   Post-op Pain Management:    Induction: Intravenous  Airway Management Planned: Natural Airway, Nasal Cannula and Simple Face Mask  Additional Equipment:   Intra-op Plan:   Post-operative Plan:   Informed Consent: I have reviewed the patients History and Physical, chart, labs and discussed the procedure including the risks, benefits and alternatives for the proposed anesthesia with the patient or authorized representative who has indicated his/her understanding and acceptance.     Plan Discussed with: CRNA and Surgeon  Anesthesia Plan Comments:         Anesthesia Quick Evaluation

## 2017-01-11 NOTE — Discharge Instructions (Signed)
Electrical Cardioversion, Care After °This sheet gives you information about how to care for yourself after your procedure. Your health care provider may also give you more specific instructions. If you have problems or questions, contact your health care provider. °What can I expect after the procedure? °After the procedure, it is common to have: °· Some redness on the skin where the shocks were given. °Follow these instructions at home: °· Do not drive for 24 hours if you were given a medicine to help you relax (sedative). °· Take over-the-counter and prescription medicines only as told by your health care provider. °· Ask your health care provider how to check your pulse. Check it often. °· Rest for 48 hours after the procedure or as told by your health care provider. °· Avoid or limit your caffeine use as told by your health care provider. °Contact a health care provider if: °· You feel like your heart is beating too quickly or your pulse is not regular. °· You have a serious muscle cramp that does not go away. °Get help right away if: °· You have discomfort in your chest. °· You are dizzy or you feel faint. °· You have trouble breathing or you are short of breath. °· Your speech is slurred. °· You have trouble moving an arm or leg on one side of your body. °· Your fingers or toes turn cold or blue. °This information is not intended to replace advice given to you by your health care provider. Make sure you discuss any questions you have with your health care provider. °Document Released: 10/01/2013 Document Revised: 07/14/2016 Document Reviewed: 06/16/2016 °Elsevier Interactive Patient Education © 2017 [employer] ° ° ° ° ° ° ° ° ° ° ° ° ° ° ° ° ° ° ° ° ° ° ° ° ° ° ° °Monitored Anesthesia Care, Care After °These instructions provide you with information about caring for yourself after your procedure. Your health care provider may also give you more specific instructions. Your treatment has been planned  according to current medical practices, but problems sometimes occur. Call your health care provider if you have any problems or questions after your procedure. °What can I expect after the procedure? °After your procedure, it is common to: °· Feel sleepy for several hours. °· Feel clumsy and have poor balance for several hours. °· Feel forgetful about what happened after the procedure. °· Have poor judgment for several hours. °· Feel nauseous or vomit. °· Have a sore throat if you had a breathing tube during the procedure. °Follow these instructions at home: °For at least 24 hours after the procedure:  °· Do not: °¨ Participate in activities in which you could fall or become injured. °¨ Drive. °¨ Use heavy machinery. °¨ Drink alcohol. °¨ Take sleeping pills or medicines that cause drowsiness. °¨ Make important decisions or sign legal documents. °¨ Take care of children on your own. °· Rest. °Eating and drinking °· Follow the diet that is recommended by your health care provider. °· If you vomit, drink water, juice, or soup when you can drink without vomiting. °· Make sure you have little or no nausea before eating solid foods. °General instructions °· Have a responsible adult stay with you until you are awake and alert. °· Take over-the-counter and prescription medicines only as told by your health care provider. °· If you smoke, do not smoke without supervision. °· Keep all follow-up visits as told by your health care provider. This is important. °  Contact a health care provider if: °· You keep feeling nauseous or you keep vomiting. °· You feel light-headed. °· You develop a rash. °· You have a fever. °Get help right away if: °· You have trouble breathing. °This information is not intended to replace advice given to you by your health care provider. Make sure you discuss any questions you have with your health care provider. °Document Released: 04/02/2016 Document Revised: 08/02/2016 Document Reviewed:  04/02/2016 °Elsevier Interactive Patient Education © 2017 [employer]vier Inc[employer] ° °

## 2017-01-11 NOTE — Transfer of Care (Signed)
Immediate Anesthesia Transfer of Care Note  Patient: Jerome Kent  Procedure(s) Performed: Procedure(s): CARDIOVERSION (N/A) TRANSESOPHAGEAL ECHOCARDIOGRAM (TEE) (N/A)  Patient Location: Endoscopy Unit  Anesthesia Type:MAC  Level of Consciousness: awake, alert , oriented and patient cooperative  Airway & Oxygen Therapy: Patient Spontanous Breathing and Patient connected to nasal cannula oxygen  Post-op Assessment: Report given to RN and Post -op Vital signs reviewed and stable  Post vital signs: Reviewed and stable  Last Vitals:  Vitals:   01/11/17 0830 01/11/17 1020  BP: 129/80 (!) 89/58  Pulse: 93 78  Resp: 12 13  Temp: 36.7 C 36.7 C    Last Pain:  Vitals:   01/11/17 1020  TempSrc: Oral         Complications: No apparent anesthesia complications

## 2017-01-11 NOTE — CV Procedure (Addendum)
    Electrical Cardioversion Procedure Note Jerome Kent [id number] [birth year]  Procedure: Electrical Cardioversion Indications:  Atrial Flutter  Time Out: Verified patient identification, verified procedure,medications/allergies/relevent history reviewed, required imaging and test results available.  Performed  Procedure Details  The patient was NPO after midnight. Anesthesia was administered at the beside  By anesthesia with propofol.  Cardioversion was performed with synchronized biphasic defibrillation via AP pads with 120 joules.  1 attempt(s) were performed.  The patient converted to normal sinus rhythm. The patient tolerated the procedure well   IMPRESSION:  Successful cardioversion of atrial flutter EF 35% No thrombus on TEE    Jerome Kent 01/11/2017, 10:22 AM

## 2017-01-11 NOTE — Progress Notes (Signed)
  Echocardiogram Echocardiogram Transesophageal has been performed.  Jerome Kent 01/11/2017, 11:30 AM

## 2017-01-11 NOTE — Anesthesia Postprocedure Evaluation (Addendum)
Anesthesia Post Note  Patient: Jerome Kent  Procedure(s) Performed: Procedure(s) (LRB): CARDIOVERSION (N/A) TRANSESOPHAGEAL ECHOCARDIOGRAM (TEE) (N/A)  Patient location during evaluation: Endoscopy Anesthesia Type: General Level of consciousness: awake Pain management: pain level controlled Vital Signs Assessment: post-procedure vital signs reviewed and stable Respiratory status: spontaneous breathing Cardiovascular status: stable Postop Assessment: no signs of nausea or vomiting and adequate PO intake Anesthetic complications: no        Last Vitals:  Vitals:   01/11/17 1030 01/11/17 1040  BP: 93/60 91/66  Pulse: 74 71  Resp: 16 14  Temp:      Last Pain:  Vitals:   01/11/17 1020  TempSrc: Oral   Pain Goal:                 Halana Deisher JR,JOHN Genni Buske

## 2017-01-11 NOTE — Interval H&P Note (Signed)
History and Physical Interval Note:  01/11/2017 9:26 AM  Jerome Kent  has presented today for surgery, with the diagnosis of aflutter  The various methods of treatment have been discussed with the patient and family. After consideration of risks, benefits and other options for treatment, the patient has consented to  Procedure(s): CARDIOVERSION (N/A) TRANSESOPHAGEAL ECHOCARDIOGRAM (TEE) (N/A) as a surgical intervention .  The patient's history has been reviewed, patient examined, no change in status, stable for surgery.  I have reviewed the patient's chart and labs.  Questions were answered to the patient's satisfaction.     [employer]

## 2017-01-11 NOTE — H&P (View-Only) (Signed)
CARDIOLOGY OFFICE NOTE  Date:  01/08/2017    Jerome Kent Date of Birth: [birth year] Medical Record #[id number]  PCP:  Allean Found, MD  Cardiologist:  [facility]  Chief Complaint  Patient presents with  . Atrial Fibrillation    Work in visit - seen for Dr. Anne Fu    History of Present Illness: Jerome Kent is a 66 y.o. male who presents today for a work in visit. Seen for Dr. Anne Fu.   He has a history of obstructive sleep apnea on CPAP, hyperlipidemia, prior atrial flutter, typical, negative in inferior leads with 4-1 conduction previously status post cardioversion on 9/14 with cardiomyopathy, prior ejection fraction of 15% noted during transesophageal echocardiogram. No CAD per prior cardiac cath from 2014. Looks like medicines cut back back in 2016 after weight loss. Has had Xarelto stopped due to hematuria back in 2017.   Phone call this morning - Received call back from patient.He stated he has been dizzy and sob.Stated he feels like he did when his heart was out of rhythm. Appointment scheduled with Norma Fredrickson NP this morning at 9:30 am  Thus added to my schedule.   Comes in today. Here with his fiance. Has felt bad since yesterday. Got up and felt dizzy. Got worse during the course of the day. Had to stop and basically sit due to dizziness and dyspnea. Jittery this AM. Every time he gets up - he gets dizzy and its hard to breath. Tried to go to work this morning but felt too bad. No longer on his Xarelto due to hematuria - this was stopped back in August. Not on aspirin therapy as well. His hematuria has stopped. He says he was seen by Dr. Vernie Ammons and no etiology for his hematuria. He has OSA but has not been wearing his mask. He has gained his weight back.    Past Medical History:  Diagnosis Date  . Acute systolic heart failure (HCC)   . Atrial flutter (HCC)   . Cardiomyopathy (HCC)   . Chronic systolic heart failure (HCC)    EF 15% on TEE 9/14 during  cardioversion  . Dysrhythmia    atrial flutter  . Erectile dysfunction   . Hyperlipidemia   . Obesity   . OSA (obstructive sleep apnea)   . Osteoarthritis   . Shortness of breath   . Sleep apnea     Past Surgical History:  Procedure Laterality Date  . APPENDECTOMY    . CARDIOVERSION N/A 09/15/2013   Procedure: CARDIOVERSION;  Surgeon: [name], MD;  Location: [facility] ENDOSCOPY;  Service: Cardiovascular;  Laterality: N/A;  . LEFT HEART CATHETERIZATION WITH CORONARY ANGIOGRAM N/A 10/29/2013   Procedure: LEFT HEART CATHETERIZATION WITH CORONARY ANGIOGRAM;  Surgeon: [name], MD;  Location: [facility] CATH LAB;  Service: Cardiovascular;  Laterality: N/A;  . TEE WITHOUT CARDIOVERSION N/A 09/15/2013   Procedure: TRANSESOPHAGEAL ECHOCARDIOGRAM (TEE);  Surgeon: [name], MD;  Location: [facility] ENDOSCOPY;  Service: Cardiovascular;  Laterality: N/A;  . TOTAL HIP ARTHROPLASTY Right 2012     Medications: Current Outpatient Prescriptions  Medication Sig Dispense Refill  . amoxicillin (AMOXIL) 500 MG capsule Take 500 mg by mouth as needed. Before procedures for Dentist    . atorvastatin (LIPITOR) 40 MG tablet Take 1 tablet (40 mg total) by mouth daily. 90 tablet 3  . furosemide (LASIX) 20 MG tablet Take 20 mg by mouth daily.    [name] Kitchen lisinopril (PRINIVIL,ZESTRIL) 5 MG tablet TAKE 1 TABLET BY MOUTH EVERY DAY (PLEASE CALL  AND SCHEDULE 6 MONTH FOLLOW UP APPOINTMENT) 90 tablet 2  . metoprolol succinate (TOPROL-XL) 25 MG 24 hr tablet Take 1 tablet (25 mg total) by mouth daily. 30 tablet 11  . rivaroxaban (XARELTO) 20 MG TABS tablet Take 1 tablet (20 mg total) by mouth daily with supper. 30 tablet 6   No current facility-administered medications for this visit.     Allergies: No Known Allergies  Social History: The patient  reports that he quit smoking about 22 years ago. He has never used smokeless tobacco. He reports that he drinks about 3.6 oz of alcohol per week . He reports that he does not use drugs.     Family History: The patient's family history includes Cancer in his father; Diabetes in his brother and father; Hypertension in his mother; Stroke in his father.   Review of Systems: Please see the history of present illness.   Otherwise, the review of systems is positive for none.   All other systems are reviewed and negative.   Physical Exam: VS:  BP 98/80   Pulse 94   Ht 5\' 9"  (1.753 m)   Wt 275 lb 9.6 oz (125 kg)   BMI 40.70 kg/m  .  BMI Body mass index is 40.7 kg/m.  Wt Readings from Last 3 Encounters:  01/08/17 275 lb 9.6 oz (125 kg)  08/04/16 258 lb (117 kg)  11/24/15 247 lb (112 kg)    General: Pleasant. Morbidly obese but alert and in no acute distress.  He has gained weight.  HEENT: Normal.  Neck: Supple, no JVD, carotid bruits, or masses noted.  Cardiac: Irregular irregular rhythm. Rate is fair. No murmurs, rubs, or gallops. No edema.  Respiratory:  Lungs are clear to auscultation bilaterally with normal work of breathing.  GI: Soft and nontender. Obese.  MS: No deformity or atrophy. Gait and ROM intact.  Skin: Warm and dry. Color is normal.  Neuro:  Strength and sensation are intact and no gross focal deficits noted.  Psych: Alert, appropriate and with normal affect.   LABORATORY DATA:  EKG:  EKG is ordered today. This demonstrates atrial with variable AV block. Rate of 94. Reviewed with Dr. Elberta Fortis.   Lab Results  Component Value Date   WBC 14.5 (H) 04/26/2015   HGB 12.5 (L) 04/26/2015   HCT 36.8 (L) 04/26/2015   PLT 180.0 04/26/2015   GLUCOSE 102 (H) 04/26/2015   NA 135 04/26/2015   K 3.8 04/26/2015   CL 102 04/26/2015   CREATININE 0.72 04/26/2015   BUN 16 04/26/2015   CO2 25 04/26/2015   INR 1.6 (H) 10/23/2013    BNP (last 3 results) No results for input(s): BNP in the last 8760 hours.  ProBNP (last 3 results) No results for input(s): PROBNP in the last 8760 hours.   Other Studies Reviewed Today:  Echo Study Conclusions from 01/2014 -  Left ventricle: The cavity size was normal. Wall thickness was increased in a pattern of mild LVH. Systolic function was moderately reduced. The estimated ejection fraction was in the range of 35% to 40%. Diffuse hypokinesis. Doppler parameters are consistent with abnormal left ventricular relaxation (grade 1 diastolic dysfunction). There was a structure at the apex that was concerning for thrombus/mass in a couple of views but after reviewing entire study is likely trabeculation. - Aortic valve: There was no stenosis. - Mitral valve: Trivial regurgitation. - Left atrium: The atrium was mildly dilated. - Right ventricle: The cavity size was normal. Systolic function was  mildly reduced. - Right atrium: The atrium was mildly dilated. - Pulmonary arteries: No complete TR doppler jet so unable to estimate PA systolic pressure. - Inferior vena cava: The vessel was normal in size; the respirophasic diameter changes were in the normal range (= 50%); findings are consistent with normal central venous pressure. Impressions:  - Normal LV size with mild LV hypertrophy. EF 35-40% with diffuse hypokinesis. Normal RV size with mildly decreased systolic function. No significant valvular abnormalities.  CARDIAC CATHETERIZATION 10/2013  PROCEDURE:  Left heart catheterization with selective coronary angiography, left ventriculogram via the radial artery approach.  INDICATIONS:  66 year old male with newly diagnosed cardiomyopathy, acute systolic heart failure possible viral, possible tachycardia induced after atrial flutter/fibrillation was noted. This catheterization is being done to exclude the possibility of coronary artery disease as reason for cardiomyopathy. Repeat echocardiogram still demonstrated reduced ejection fraction in the 35% to 40% range. Original EF was approximately 15-20%. This EF was performed in the setting of transesophageal echocardiogram for atrial  flutter cardioversion.  ANGIOGRAPHIC DATA:    Left main: No angiographically significant coronary artery disease.  Left anterior descending (LAD): No angiographically significant coronary artery disease  Circumflex artery (CIRC): No angiographically significant coronary artery disease.  Right coronary artery (RCA): No angiographically significant coronary artery disease. Dominant vessel.  LEFT VENTRICULOGRAM:  Left ventricular angiogram was done in the 30 RAO projection and revealed moderately reduced left ventricular ejection fraction, EF in the 40-45% range. Post-PVC noted. Global hypokinesis.  IMPRESSIONS:  1. No angiographically significant CAD 2. Moderately reduced left ventricular systolic function.  LVEDP 12 mmHg.  Ejection fraction 40-45 %.  RECOMMENDATION:  EF has improved. We will continue with medical therapy. Blood pressure does not allow for further up titration of beta blocker or ACE inhibitor. I will as come to take his Lasix now on an every other day basis given blood pressure.    Assessment/Plan:  1. Recurrent atrial flutter - rate fair. Past cardioversion from 2014. Discussed with Dr. Elberta Fortis (DOD). Will add back Xarelto 20 mg a day. Lab today. Arrange TEE cardioversion for the end of this week after 3 doses of Xarelto - scheduled with Dr. Anne Fu for Thursday. Will arrange EP referral to consider ablation. The procedure/risks/benefits have been discussed and he is anxious to proceed.   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 I suspect current symptoms are related to being back out of rhythm. He does not look to be in overt heart failure.   3. Obesity  4. OSA - not using his CPAP  Current medicines are reviewed with the patient today.  The patient does not have concerns regarding medicines other than what has been noted above.  The following changes have been made:  See above.  Labs/ tests ordered today  include:    Orders Placed This Encounter  Procedures  . Basic metabolic panel  . CBC  . Protime-INR  . APTT  . Ambulatory referral to Cardiac Electrophysiology  . EKG 12-Lead     Disposition:   Further disposition to follow.   Patient is agreeable to this plan and will call if any problems develop in the interim.   Signed: Rosalio Macadamia, RN, ANP-C 01/08/2017 10:07 AM  [facility] [address] [place], [place]  [address] Phone: [phone] Fax: [phone]

## 2017-01-11 NOTE — Interval H&P Note (Signed)
History and Physical Interval Note:  01/11/2017 9:46 AM  Jerome Kent  has presented today for surgery, with the diagnosis of aflutter  The various methods of treatment have been discussed with the patient and family. After consideration of risks, benefits and other options for treatment, the patient has consented to  Procedure(s): CARDIOVERSION (N/A) TRANSESOPHAGEAL ECHOCARDIOGRAM (TEE) (N/A) as a surgical intervention .  The patient's history has been reviewed, patient examined, no change in status, stable for surgery.  I have reviewed the patient's chart and labs.  Questions were answered to the patient's satisfaction.     [employer]

## 2017-01-12 ENCOUNTER — Encounter (HOSPITAL_COMMUNITY): Payer: Self-pay | Admitting: Cardiology

## 2017-01-17 ENCOUNTER — Encounter: Payer: Self-pay | Admitting: *Deleted

## 2017-01-23 ENCOUNTER — Other Ambulatory Visit: Payer: Self-pay | Admitting: Cardiology

## 2017-01-23 ENCOUNTER — Encounter: Payer: Self-pay | Admitting: *Deleted

## 2017-01-23 ENCOUNTER — Telehealth: Payer: Self-pay | Admitting: *Deleted

## 2017-01-23 ENCOUNTER — Encounter (INDEPENDENT_AMBULATORY_CARE_PROVIDER_SITE_OTHER): Payer: Self-pay

## 2017-01-23 ENCOUNTER — Encounter: Payer: Self-pay | Admitting: Cardiology

## 2017-01-23 ENCOUNTER — Ambulatory Visit (INDEPENDENT_AMBULATORY_CARE_PROVIDER_SITE_OTHER): Payer: PPO | Admitting: Cardiology

## 2017-01-23 VITALS — BP 100/64 | HR 61 | Ht 69.0 in | Wt 274.6 lb

## 2017-01-23 DIAGNOSIS — I483 Typical atrial flutter: Secondary | ICD-10-CM

## 2017-01-23 DIAGNOSIS — Z01812 Encounter for preprocedural laboratory examination: Secondary | ICD-10-CM

## 2017-01-23 NOTE — Progress Notes (Signed)
Electrophysiology Office Note   Date:  01/23/2017   ID:  Jerome Kent, DOB December 07, 1951, MRN [id number]  PCP:  Allean Found, MD  Cardiologist:  Margot Chimes Primary Electrophysiologist:  Regan Lemming, MD    Chief Complaint  Patient presents with  . Atrial Flutter     History of Present Illness: Jerome Kent is a 66 y.o. male who presents today for electrophysiology evaluation.   He has a history of obstructive sleep apnea on CPAP, hyperlipidemia, atrial flutter, and heart failure with prior EF of 15%. Cardiac cath in 2014 showed no coronary disease. He presented to the office on 1/15 feeling dizzy,with shortness of breath on exertion. He was found to be in atrial flutter and subsequent had a TEE and cardioversion.  Today, he denies symptoms of palpitations, chest pain, orthopnea, PND, lower extremity edema, claudication, dizziness, presyncope, syncope, bleeding, or neurologic sequela. The patient is tolerating medications without difficulties and is otherwise without complaint today. He is felt well after his cardioversion for atrial flutter. He does have some continued shortness of breath with exertion. Of note he stopped his Xarelto last August due to episodes of gross hematuria. He had a urology evaluation that found no obvious source.   Past Medical History:  Diagnosis Date  . Acute systolic heart failure (HCC)   . Atrial flutter (HCC)   . Cardiomyopathy (HCC)   . Chronic systolic heart failure (HCC)    EF 15% on TEE 9/14 during cardioversion  . Dysrhythmia    atrial flutter  . Erectile dysfunction   . Hyperlipidemia   . Obesity   . OSA (obstructive sleep apnea)   . Osteoarthritis   . Shortness of breath   . Sleep apnea    Past Surgical History:  Procedure Laterality Date  . APPENDECTOMY    . CARDIOVERSION N/A 09/15/2013   Procedure: CARDIOVERSION;  Surgeon: [name], MD;  Location: [facility] ENDOSCOPY;  Service: Cardiovascular;  Laterality:  N/A;  . CARDIOVERSION N/A 01/11/2017   Procedure: CARDIOVERSION;  Surgeon: Jake Bathe, MD;  Location: [facility] ENDOSCOPY;  Service: Cardiovascular;  Laterality: N/A;  . LEFT HEART CATHETERIZATION WITH CORONARY ANGIOGRAM N/A 10/29/2013   Procedure: LEFT HEART CATHETERIZATION WITH CORONARY ANGIOGRAM;  Surgeon: [name], MD;  Location: [facility] CATH LAB;  Service: Cardiovascular;  Laterality: N/A;  . TEE WITHOUT CARDIOVERSION N/A 09/15/2013   Procedure: TRANSESOPHAGEAL ECHOCARDIOGRAM (TEE);  Surgeon: [name], MD;  Location: [facility] ENDOSCOPY;  Service: Cardiovascular;  Laterality: N/A;  . TEE WITHOUT CARDIOVERSION N/A 01/11/2017   Procedure: TRANSESOPHAGEAL ECHOCARDIOGRAM (TEE);  Surgeon: Jake Bathe, MD;  Location: [facility] ENDOSCOPY;  Service: Cardiovascular;  Laterality: N/A;  . TOTAL HIP ARTHROPLASTY Right 2012     Current Outpatient Prescriptions  Medication Sig Dispense Refill  . amoxicillin (AMOXIL) 500 MG capsule Take 500 mg by mouth as needed. Before procedures for Dentist    . atorvastatin (LIPITOR) 40 MG tablet Take 1 tablet (40 mg total) by mouth daily. 90 tablet 3  . furosemide (LASIX) 20 MG tablet Take 20 mg by mouth daily.    [name] Kitchen lisinopril (PRINIVIL,ZESTRIL) 5 MG tablet TAKE 1 TABLET BY MOUTH EVERY DAY (PLEASE CALL AND SCHEDULE 6 MONTH FOLLOW UP APPOINTMENT) 90 tablet 2  . metoprolol succinate (TOPROL-XL) 25 MG 24 hr tablet Take 1 tablet (25 mg total) by mouth daily. 30 tablet 11  . rivaroxaban (XARELTO) 20 MG TABS tablet Take 1 tablet (20 mg total) by mouth daily with supper. 30 tablet 6   No current  facility-administered medications for this visit.     Allergies:   Patient has no known allergies.   Social History:  The patient  reports that he quit smoking about 22 years ago. He has never used smokeless tobacco. He reports that he drinks about 3.6 oz of alcohol per week . He reports that he does not use drugs.   Family History:  The patient's family history includes Cancer in his father;  Diabetes in his brother and father; Hypertension in his mother; Stroke in his father.    ROS:  Please see the history of present illness.   Otherwise, review of systems is positive for DOE.   All other systems are reviewed and negative.    PHYSICAL EXAM: VS:  BP 100/64   Pulse 61   Ht 5\' 9"  (1.753 m)   Wt 274 lb 9.6 oz (124.6 kg)   SpO2 98%   BMI 40.55 kg/m  , BMI Body mass index is 40.55 kg/m. GEN: Well nourished, well developed, in no acute distress  HEENT: normal  Neck: no JVD, carotid bruits, or masses Cardiac: RRR; no murmurs, rubs, or gallops,no edema  Respiratory:  clear to auscultation bilaterally, normal work of breathing GI: soft, nontender, nondistended, + BS MS: no deformity or atrophy  Skin: warm and dry Neuro:  Strength and sensation are intact Psych: euthymic mood, full affect  EKG:  EKG is ordered today. Personal review of the ekg ordered shows sinus rhythm, rate 61  Recent Labs: 01/08/2017: BUN 15; Creatinine, Ser 0.95; Platelets 215; Potassium 4.8; Sodium 142    Lipid Panel  No results found for: CHOL, TRIG, HDL, CHOLHDL, VLDL, LDLCALC, LDLDIRECT   Wt Readings from Last 3 Encounters:  01/23/17 274 lb 9.6 oz (124.6 kg)  01/11/17 275 lb 9.6 oz (125 kg)  01/08/17 275 lb 9.6 oz (125 kg)      Other studies Reviewed: Additional studies/ records that were reviewed today include: TEE 01/12/16  Review of the above records today demonstrates:  - Left ventricle: The cavity size was normal. Wall thickness was   normal. Systolic function was moderately to severely reduced. The   estimated ejection fraction was in the range of 30% to 35%.   Diffuse hypokinesis. - Aortic valve: No evidence of vegetation. - Mitral valve: No evidence of vegetation. There was trivial   regurgitation. - Left atrium: No evidence of thrombus in the appendage. No   evidence of thrombus in the appendage. - Right atrium: No evidence of thrombus in the atrial cavity or   appendage. -  Atrial septum: No defect or patent foramen ovale was identified. - Tricuspid valve: No evidence of vegetation. - Pulmonic valve: No evidence of vegetation. - Superior vena cava: The study excluded a thrombus.   ASSESSMENT AND PLAN:  1.  Atrial flutter, typical: Was recently started on Xarelto as well as a TEE and cardioversion. He is feeling well today, but does have episodes of shortness of breath. He is in sinus rhythm currently. On his EKG, it does appear that he has typical atrial flutter. We discussed options of therapy including ablation. Risks and benefits of ablation were discussed. Risks include, heart block, and stroke, among others. He understands these risks and has agreed to the procedure.  2. 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 EF was 30-35% on TEE. Jerome Kent need to reassess in a few months now that he is back in sinus rhythm.  3. Obesity: Encouraged weight loss  4. Sleep apnea: Encouraged  CPAP compliance    Current medicines are reviewed at length with the patient today.   The patient does not have concerns regarding his medicines.  The following changes were made today:  none  Labs/ tests ordered today include:  Orders Placed This Encounter  Procedures  . EKG 12-Lead     Disposition:   FU with Jerome Kent 3 months  Signed, Jerome Thien Jorja Loa, MD  01/23/2017 9:17 AM     [facility] [address] [place] [address] [phone] (office) [phone] (fax)

## 2017-01-23 NOTE — Telephone Encounter (Signed)
Received a message from Dory Horn, RN, stated pt was having an ablation but scheduled to see Lawson Fiscal in 3 days after.  Lawson Fiscal stated cancel appointment with her and reschedule with Dr. Anne Fu in 3 months with notes stating pt might need a possible echo. Pt is aware and agreeable to plan.

## 2017-01-23 NOTE — Patient Instructions (Signed)
Medication Instructions:    Your physician recommends that you continue on your current medications as directed. Please refer to the Current Medication list given to you today.  --- If you need a refill on your cardiac medications before your next appointment, please call your pharmacy. ---  Labwork:  Your physician recommends that you return for pre procedure lab work between 2/2 - 2/13 for BMET & CBC w/ diff.  Testing/Procedures: Your physician has recommended that you have an ablation. Catheter ablation is a medical procedure used to treat some cardiac arrhythmias (irregular heartbeats). During catheter ablation, a long, thin, flexible tube is put into a blood vessel in your groin (upper thigh), or neck. This tube is called an ablation catheter. It is then guided to your heart through the blood vessel. Radio frequency waves destroy small areas of heart tissue where abnormal heartbeats may cause an arrhythmia to start. Please see the instruction sheet given to you today.  Follow-Up:  Your physician recommends that you schedule a follow-up appointment in: 4 weeks, after your procedure on 02/09/2017, with Dr. Elberta Fortis.  Thank you for choosing [facility]!!   Dory Horn, RN [phone]   Any Other Special Instructions Will Be Listed Below (If Applicable).  Cardiac Ablation Cardiac ablation is a procedure to disable a small amount of heart tissue in very specific places. The heart has many electrical connections. Sometimes these connections are abnormal and can cause the heart to beat very fast or irregularly. By disabling some of the problem areas, heart rhythm can be improved or made normal. Ablation is done for people who:   Have Wolff-Parkinson-White syndrome.   Have other fast heart rhythms (tachycardia).   Have taken medicines for an abnormal heart rhythm (arrhythmia) that resulted in:   No success.   Side effects.   May have a high-risk heartbeat that could  result in death.  LET [facility] CARE PROVIDER KNOW ABOUT:   Any allergies you have or any previous reactions you have had to X-ray dye, food (such as seafood), medicine, or tape.   All medicines you are taking, including vitamins, herbs, eye drops, creams, and over-the-counter medicines.   Previous problems you or members of your family have had with the use of anesthetics.   Any blood disorders you have.   Previous surgeries or procedures (such as a kidney transplant) you have had.   Medical conditions you have (such as kidney failure).  RISKS AND COMPLICATIONS Generally, cardiac ablation is a safe procedure. However, problems can occur and include:   Increased risk of cancer. Depending on how long it takes to do the ablation, the dose of radiation can be high.  Bruising and bleeding where a thin, flexible tube (catheter) was inserted during the procedure.   Bleeding into the chest, especially into the sac that surrounds the heart (serious).  Need for a permanent pacemaker if the normal electrical system is damaged.   The procedure may not be fully effective, and this may not be recognized for months. Repeat ablation procedures are sometimes required. BEFORE THE PROCEDURE   Follow any instructions from your health care provider regarding eating and drinking before the procedure.   Take your medicines as directed at regular times with water, unless instructed otherwise by your health care provider. If you are taking diabetes medicine, including insulin, ask how you are to take it and if there are any special instructions you should follow. It is common to adjust insulin dosing the day of the  ablation.  PROCEDURE  An ablation is usually performed in a catheterization laboratory with the guidance of fluoroscopy. Fluoroscopy is a type of X-ray that helps your health care provider see images of your heart during the procedure.   An ablation is a minimally invasive  procedure. This means a small cut (incision) is made in either your neck or groin. Your health care provider will decide where to make the incision based on your medical history and physical exam.  An IV tube will be started before the procedure begins. You will be given an anesthetic or medicine to help you relax (sedative).  The skin on your neck or groin will be numbed. A needle will be inserted into a large vein in your neck or groin and catheters will be threaded to your heart.  A special dye that shows up on fluoroscopy pictures may be injected through the catheter. The dye helps your health care provider see the area of the heart that needs treatment.  The catheter has electrodes on the tip. When the area of heart tissue that is causing the arrhythmia is found, the catheter tip will send an electrical current to the area and "scar" the tissue. Three types of energy can be used to ablate the heart tissue:   Heat (radiofrequency energy).   Laser energy.   Extreme cold (cryoablation).   When the area of the heart has been ablated, the catheter will be taken out. Pressure will be held on the insertion site. This will help the insertion site clot and keep it from bleeding. A bandage will be placed on the insertion site.  AFTER THE PROCEDURE   After the procedure, you will be taken to a recovery area where your vital signs (blood pressure, heart rate, and breathing) will be monitored. The insertion site will also be monitored for bleeding.   You will need to lie still for 4-6 hours. This is to ensure you do not bleed from the catheter insertion site.  This information is not intended to replace advice given to you by your health care provider. Make sure you discuss any questions you have with your health care provider. Document Released: 04/29/2009 Document Revised: 01/01/2015 Document Reviewed: 05/05/2013 Elsevier Interactive Patient Education  2017 [employer].

## 2017-01-23 NOTE — Addendum Note (Signed)
Addended by: Baird Lyons on: 01/23/2017 09:24 AM   Modules accepted: Orders

## 2017-01-24 ENCOUNTER — Telehealth: Payer: Self-pay | Admitting: Cardiology

## 2017-01-24 NOTE — Telephone Encounter (Signed)
Note not needed 

## 2017-02-02 ENCOUNTER — Other Ambulatory Visit: Payer: PPO | Admitting: *Deleted

## 2017-02-02 DIAGNOSIS — I483 Typical atrial flutter: Secondary | ICD-10-CM

## 2017-02-02 DIAGNOSIS — Z01812 Encounter for preprocedural laboratory examination: Secondary | ICD-10-CM

## 2017-02-02 LAB — CBC WITH DIFFERENTIAL/PLATELET
Basophils Absolute: 0 10*3/uL (ref 0.0–0.2)
Basos: 0 %
EOS (ABSOLUTE): 0.1 10*3/uL (ref 0.0–0.4)
Eos: 1 %
Hematocrit: 43.1 % (ref 37.5–51.0)
Hemoglobin: 14.9 g/dL (ref 13.0–17.7)
Immature Grans (Abs): 0 10*3/uL (ref 0.0–0.1)
Immature Granulocytes: 0 %
Lymphocytes Absolute: 2 10*3/uL (ref 0.7–3.1)
Lymphs: 42 %
MCH: 32.3 pg (ref 26.6–33.0)
MCHC: 34.6 g/dL (ref 31.5–35.7)
MCV: 94 fL (ref 79–97)
Monocytes Absolute: 0.5 10*3/uL (ref 0.1–0.9)
Monocytes: 11 %
Neutrophils Absolute: 2.2 10*3/uL (ref 1.4–7.0)
Neutrophils: 46 %
Platelets: 179 10*3/uL (ref 150–379)
RBC: 4.61 x10E6/uL (ref 4.14–5.80)
RDW: 13.2 % (ref 12.3–15.4)
WBC: 4.8 10*3/uL (ref 3.4–10.8)

## 2017-02-02 LAB — BASIC METABOLIC PANEL
BUN/Creatinine Ratio: 26 — ABNORMAL HIGH (ref 10–24)
BUN: 18 mg/dL (ref 8–27)
CO2: 20 mmol/L (ref 18–29)
Calcium: 9.2 mg/dL (ref 8.6–10.2)
Chloride: 101 mmol/L (ref 96–106)
Creatinine, Ser: 0.7 mg/dL — ABNORMAL LOW (ref 0.76–1.27)
GFR calc Af Amer: 114 mL/min/{1.73_m2} (ref >59)
GFR calc non Af Amer: 99 mL/min/{1.73_m2} (ref >59)
Glucose: 107 mg/dL — ABNORMAL HIGH (ref 65–99)
Potassium: 4.8 mmol/L (ref 3.5–5.2)
Sodium: 141 mmol/L (ref 134–144)

## 2017-02-05 ENCOUNTER — Ambulatory Visit: Payer: PPO | Admitting: Nurse Practitioner

## 2017-02-09 ENCOUNTER — Ambulatory Visit (HOSPITAL_COMMUNITY): Payer: PPO | Admitting: Critical Care Medicine

## 2017-02-09 ENCOUNTER — Ambulatory Visit (HOSPITAL_COMMUNITY)
Admission: RE | Admit: 2017-02-09 | Discharge: 2017-02-09 | Disposition: A | Discharge status: Home / Self Care | Payer: PPO | Source: Ambulatory Visit | Attending: Cardiology | Admitting: Cardiology

## 2017-02-09 ENCOUNTER — Encounter (HOSPITAL_COMMUNITY)
Admission: RE | Disposition: A | Discharge status: Home / Self Care | Payer: Self-pay | Source: Ambulatory Visit | Attending: Cardiology

## 2017-02-09 ENCOUNTER — Encounter (HOSPITAL_COMMUNITY): Payer: Self-pay | Admitting: Critical Care Medicine

## 2017-02-09 DIAGNOSIS — I5022 Chronic systolic (congestive) heart failure: Secondary | ICD-10-CM | POA: Diagnosis not present

## 2017-02-09 DIAGNOSIS — Z87891 Personal history of nicotine dependence: Secondary | ICD-10-CM | POA: Diagnosis not present

## 2017-02-09 DIAGNOSIS — I4892 Unspecified atrial flutter: Secondary | ICD-10-CM | POA: Insufficient documentation

## 2017-02-09 DIAGNOSIS — I4891 Unspecified atrial fibrillation: Secondary | ICD-10-CM | POA: Insufficient documentation

## 2017-02-09 DIAGNOSIS — Z6841 Body Mass Index (BMI) 40.0 and over, adult: Secondary | ICD-10-CM | POA: Diagnosis not present

## 2017-02-09 DIAGNOSIS — I509 Heart failure, unspecified: Secondary | ICD-10-CM | POA: Diagnosis not present

## 2017-02-09 DIAGNOSIS — I483 Typical atrial flutter: Secondary | ICD-10-CM | POA: Diagnosis present

## 2017-02-09 DIAGNOSIS — I471 Supraventricular tachycardia: Secondary | ICD-10-CM | POA: Diagnosis not present

## 2017-02-09 DIAGNOSIS — G4733 Obstructive sleep apnea (adult) (pediatric): Secondary | ICD-10-CM | POA: Insufficient documentation

## 2017-02-09 DIAGNOSIS — E785 Hyperlipidemia, unspecified: Secondary | ICD-10-CM | POA: Insufficient documentation

## 2017-02-09 DIAGNOSIS — I429 Cardiomyopathy, unspecified: Secondary | ICD-10-CM | POA: Diagnosis not present

## 2017-02-09 DIAGNOSIS — M1990 Unspecified osteoarthritis, unspecified site: Secondary | ICD-10-CM | POA: Insufficient documentation

## 2017-02-09 HISTORY — PX: A-FLUTTER ABLATION: EP1230

## 2017-02-09 SURGERY — A-FLUTTER ABLATION
Anesthesia: Monitor Anesthesia Care

## 2017-02-09 MED ORDER — PROPOFOL 10 MG/ML IV BOLUS
INTRAVENOUS | Status: DC | PRN
  Administered 2017-02-09: 20 mg via INTRAVENOUS

## 2017-02-09 MED ORDER — SODIUM CHLORIDE 0.9 % IV SOLN
INTRAVENOUS | Status: DC | PRN
  Administered 2017-02-09: 10:00:00 via INTRAVENOUS

## 2017-02-09 MED ORDER — IBUPROFEN 200 MG PO TABS: 400.0000 mg | ORAL_TABLET | Freq: Every day | ORAL | Status: DC | PRN

## 2017-02-09 MED ORDER — OFF THE BEAT BOOK
Freq: Once | Status: AC
  Administered 2017-02-09: 18:00:00
  Filled 2017-02-09: qty 1

## 2017-02-09 MED ORDER — ACETAMINOPHEN 325 MG PO TABS: 650.0000 mg | ORAL_TABLET | ORAL | Status: DC | PRN

## 2017-02-09 MED ORDER — BUPIVACAINE HCL (PF) 0.25 % IJ SOLN
INTRAMUSCULAR | Status: DC | PRN
  Administered 2017-02-09: 35 mL

## 2017-02-09 MED ORDER — FUROSEMIDE 20 MG PO TABS
20.0000 mg | ORAL_TABLET | Freq: Every day | ORAL | Status: DC
  Administered 2017-02-09: 20 mg via ORAL
  Filled 2017-02-09: qty 1

## 2017-02-09 MED ORDER — HEPARIN (PORCINE) IN NACL 2-0.9 UNIT/ML-% IJ SOLN
INTRAMUSCULAR | Status: DC | PRN
  Administered 2017-02-09: 11:00:00

## 2017-02-09 MED ORDER — LIDOCAINE HCL (CARDIAC) 20 MG/ML IV SOLN
INTRAVENOUS | Status: DC | PRN
  Administered 2017-02-09: 40 mg via INTRAVENOUS

## 2017-02-09 MED ORDER — SODIUM CHLORIDE 0.9 % IV SOLN: 250.0000 mL | INTRAVENOUS | Status: DC | PRN

## 2017-02-09 MED ORDER — METOPROLOL SUCCINATE ER 25 MG PO TB24
25.0000 mg | ORAL_TABLET | Freq: Every day | ORAL | Status: DC
  Administered 2017-02-09 (×2): 25 mg via ORAL
  Filled 2017-02-09 (×2): qty 1

## 2017-02-09 MED ORDER — PROPOFOL 500 MG/50ML IV EMUL
INTRAVENOUS | Status: DC | PRN
  Administered 2017-02-09: 100 ug/kg/min via INTRAVENOUS
  Administered 2017-02-09: 12:00:00 via INTRAVENOUS

## 2017-02-09 MED ORDER — BUPIVACAINE HCL (PF) 0.25 % IJ SOLN
INTRAMUSCULAR | Status: AC
  Filled 2017-02-09: qty 30

## 2017-02-09 MED ORDER — RIVAROXABAN 20 MG PO TABS
20.0000 mg | ORAL_TABLET | Freq: Every day | ORAL | Status: DC
  Administered 2017-02-09: 20 mg via ORAL
  Filled 2017-02-09: qty 1

## 2017-02-09 MED ORDER — HEPARIN (PORCINE) IN NACL 2-0.9 UNIT/ML-% IJ SOLN
INTRAMUSCULAR | Status: AC
  Filled 2017-02-09: qty 500

## 2017-02-09 MED ORDER — FENTANYL CITRATE (PF) 100 MCG/2ML IJ SOLN
INTRAMUSCULAR | Status: DC | PRN
  Administered 2017-02-09: 25 ug via INTRAVENOUS
  Administered 2017-02-09: 50 ug via INTRAVENOUS
  Administered 2017-02-09 (×5): 25 ug via INTRAVENOUS

## 2017-02-09 MED ORDER — LIVING BETTER WITH HEART FAILURE BOOK
Freq: Once | Status: AC
  Administered 2017-02-09: 18:00:00

## 2017-02-09 MED ORDER — LISINOPRIL 5 MG PO TABS
5.0000 mg | ORAL_TABLET | Freq: Every day | ORAL | Status: DC
  Administered 2017-02-09: 14:00:00 5 mg via ORAL
  Filled 2017-02-09: qty 1

## 2017-02-09 MED ORDER — MIDAZOLAM HCL 5 MG/5ML IJ SOLN
INTRAMUSCULAR | Status: DC | PRN
  Administered 2017-02-09: 2 mg via INTRAVENOUS

## 2017-02-09 MED ORDER — SODIUM CHLORIDE 0.9% FLUSH: 3.0000 mL | INTRAVENOUS | Status: DC | PRN

## 2017-02-09 MED ORDER — ONDANSETRON HCL 4 MG/2ML IJ SOLN: 4.0000 mg | Freq: Four times a day (QID) | INTRAMUSCULAR | Status: DC | PRN

## 2017-02-09 MED ORDER — ATORVASTATIN CALCIUM 40 MG PO TABS
40.0000 mg | ORAL_TABLET | Freq: Every day | ORAL | Status: DC
  Administered 2017-02-09: 18:00:00 40 mg via ORAL
  Filled 2017-02-09: qty 1

## 2017-02-09 MED ORDER — SODIUM CHLORIDE 0.9% FLUSH
3.0000 mL | Freq: Two times a day (BID) | INTRAVENOUS | Status: DC
  Administered 2017-02-09: 3 mL via INTRAVENOUS

## 2017-02-09 SURGICAL SUPPLY — 16 items
BAG SNAP BAND KOVER 36X36 (MISCELLANEOUS) ×2 IMPLANT
BLANKET WARM UNDERBOD FULL ACC (MISCELLANEOUS) ×2 IMPLANT
CATH EZ STEER NAV 8MM D-F CUR (ABLATOR) ×2 IMPLANT
CATH JOSEPH QUAD ALLRED 6F REP (CATHETERS) ×2 IMPLANT
CATH WEBSTER BI DIR CS D-F CRV (CATHETERS) ×2 IMPLANT
HOVERMATT SINGLE USE (MISCELLANEOUS) ×2 IMPLANT
PACK EP LATEX FREE (CUSTOM PROCEDURE TRAY) ×1
PACK EP LF (CUSTOM PROCEDURE TRAY) ×1 IMPLANT
PAD DEFIB LIFELINK (PAD) ×2 IMPLANT
PATCH CARTO3 (PAD) ×2 IMPLANT
SHEATH PINNACLE 6F 10CM (SHEATH) ×2 IMPLANT
SHEATH PINNACLE 7F 10CM (SHEATH) ×2 IMPLANT
SHEATH PINNACLE 8F 10CM (SHEATH) ×2 IMPLANT
SHEATH PINNACLE 9F 10CM (SHEATH) ×2 IMPLANT
SHEATH SWARTZ RAMP 8.5F 60CM (SHEATH) ×2 IMPLANT
SHIELD RADPAD SCOOP 12X17 (MISCELLANEOUS) ×2 IMPLANT

## 2017-02-09 NOTE — Progress Notes (Signed)
Discharge instructions reviewed with patient and patient's girlfriend. Stressed the importance of monitoring his daily weights and adhering to Low sodium cardiac diet. No new medications prescribed.R groin site level 0. VSS. Belongings given to girlfriend.

## 2017-02-09 NOTE — Anesthesia Preprocedure Evaluation (Addendum)
Anesthesia Evaluation  Patient identified by MRN, date of birth, ID band Patient awake    Reviewed: Allergy & Precautions, NPO status , Patient's Chart, lab work & pertinent test results  Airway Mallampati: II  TM Distance: >3 FB Neck ROM: Full    Dental  (+) Teeth Intact, Dental Advisory Given   Pulmonary shortness of breath, sleep apnea , former smoker,    breath sounds clear to auscultation       Cardiovascular + dysrhythmias Atrial Fibrillation  Rhythm:Regular Rate:Normal  EF 30-35%, normal valves- echo 01/01/17   Neuro/Psych    GI/Hepatic   Endo/Other  Morbid obesity  Renal/GU      Musculoskeletal  (+) Arthritis ,   Abdominal (+) + obese,   Peds  Hematology   Anesthesia Other Findings   Reproductive/Obstetrics                           Anesthesia Physical Anesthesia Plan  ASA: III  Anesthesia Plan: General   Post-op Pain Management:    Induction: Intravenous  Airway Management Planned: Oral ETT  Additional Equipment:   Intra-op Plan:   Post-operative Plan: Extubation in OR  Informed Consent: I have reviewed the patients History and Physical, chart, labs and discussed the procedure including the risks, benefits and alternatives for the proposed anesthesia with the patient or authorized representative who has indicated his/her understanding and acceptance.   Dental advisory given  Plan Discussed with: Anesthesiologist and Surgeon  Anesthesia Plan Comments:         Anesthesia Quick Evaluation

## 2017-02-09 NOTE — Progress Notes (Signed)
Site area:Right groin a 6, 7, 9 french venous sheaths were removed  Site Prior to Removal:  Level 0  Pressure Applied For 20 MINUTES    Bedrest Beginning at 1250p  Manual:   Yes.    Patient Status During Pull:  stable  Post Pull Groin Site:  Level 0  Post Pull Instructions Given:  Yes.    Post Pull Pulses Present:  Yes.    Dressing Applied:  Yes.    Comments:  VS remain stable during sheath pull

## 2017-02-09 NOTE — Progress Notes (Signed)
The patient is seen by Dr. Elberta Fortis post procedure and OK to discharge once bedrest is completed. Activity restrictions were discussed with the patient Telemetry is SR, VSS Follow up has been arranged  Francis Dowse, PA-C

## 2017-02-09 NOTE — Plan of Care (Signed)
Problem: Food- and Nutrition-Related Knowledge Deficit (NB-1.1) Goal: Nutrition education Formal process to instruct or train a patient/client in a skill or to impart knowledge to help patients/clients voluntarily manage or modify food choices and eating behavior to maintain or improve health. Outcome: Completed/Met Date Met: 02/09/17 Nutrition Education Note  RD consulted for nutrition education regarding new onset CHF.[name] Kitchen Pt with [place] heritage and loves to cook. We discussed multiple recipes and modifications.   RD provided "Low Sodium Nutrition Therapy" handout from the Academy of Nutrition and Dietetics. Reviewed patient's dietary recall. Provided examples on ways to decrease sodium intake in diet. Discouraged intake of processed foods and use of salt shaker. Encouraged fresh fruits and vegetables as well as whole grain sources of carbohydrates to maximize fiber intake.   RD discussed why it is important for patient to adhere to diet recommendations, and emphasized the role of fluids, foods to avoid, and importance of weighing self daily. Teach back method used.  Expect good compliance.  Body mass index is 40.32 kg/m. Pt meets criteria for morbidly obese based on current BMI.  Current diet order is Heart Healthy, patient is consuming approximately 100% of meals at this time. Labs and medications reviewed. No further nutrition interventions warranted at this time. RD contact information provided. If additional nutrition issues arise, please re-consult RD.   [place], [place], [place] Pager [phone] After Hours Pager

## 2017-02-09 NOTE — Care Management Note (Signed)
Case Management Note  Patient Details  Name: Jerome Kent MRN: [id number] Date of Birth: Jul 28, 1951  Subjective/Objective:    S/p aflutter ablation, NCM will cont to follow for dc needs.                 Action/Plan:   Expected Discharge Date:                  Expected Discharge Plan:  Home/Self Care  In-House Referral:     Discharge planning Services  CM Consult  Post Acute Care Choice:    Choice offered to:     DME Arranged:    DME Agency:     HH Arranged:    HH Agency:     Status of Service:  Completed, signed off  If discussed at [employer] of Stay Meetings, dates discussed:    Additional Comments:  Leone Haven, RN 02/09/2017, 3:00 PM

## 2017-02-09 NOTE — H&P (Signed)
Jerome Kent is a 66 y.o. male with a history of obstructive sleep apnea on CPAP, hyperlipidemia, atrial flutter, and heart failure with prior EF of 15%. He was found to be in atrial flutter on 1/15 and had a TEE/CV performed. He presents today for ablation of atrial flutter. On exam, regular rhythm, no murmurs, lungs clear. He has been complaint with his Xarelto. Risks and benefits discussed. Risks include but not limited to bleeding, tamponade, heart block, and stroke. He understands the risks of the procedure and has agreed to the ablation.  [name], MD 02/09/2017 7:59 AM

## 2017-02-09 NOTE — Discharge Instructions (Addendum)
No driving for 1 week. No lifting over 5 lbs for 1 week. No vigorous or sexual activity for 1 week. You may return to work on 02/16/17. Keep procedure site clean & dry. If you notice increased pain, swelling, bleeding or pus, call/return!  You may shower, but no soaking baths/hot tubs/pools for 1 week.    Information on my medicine - XARELTO (Rivaroxaban)  This medication education was reviewed with me or my healthcare representative as part of my discharge preparation.    Why was Xarelto prescribed for you? Xarelto was prescribed for you to reduce the risk of a blood clot forming that can cause a stroke if you have a medical condition called atrial fibrillation (a type of irregular heartbeat).  What do you need to know about xarelto ? Take your Xarelto ONCE DAILY at the same time every day with your evening meal. If you have difficulty swallowing the tablet whole, you may crush it and mix in applesauce just prior to taking your dose.  Take Xarelto exactly as prescribed by your doctor and DO NOT stop taking Xarelto without talking to the doctor who prescribed the medication.  Stopping without other stroke prevention medication to take the place of Xarelto may increase your risk of developing a clot that causes a stroke.  Refill your prescription before you run out.  After discharge, you should have regular check-up appointments with your healthcare provider that is prescribing your Xarelto.  In the future your dose may need to be changed if your kidney function or weight changes by a significant amount.  What do you do if you miss a dose? If you are taking Xarelto ONCE DAILY and you miss a dose, take it as soon as you remember on the same day then continue your regularly scheduled once daily regimen the next day. Do not take two doses of Xarelto at the same time or on the same day.   Important Safety Information A possible side effect of Xarelto is bleeding. You should call your  healthcare provider right away if you experience any of the following: ? Bleeding from an injury or your nose that does not stop. ? Unusual colored urine (red or dark brown) or unusual colored stools (red or black). ? Unusual bruising for unknown reasons. ? A serious fall or if you hit your head (even if there is no bleeding).  Some medicines may interact with Xarelto and might increase your risk of bleeding while on Xarelto. To help avoid this, consult your healthcare provider or pharmacist prior to using any new prescription or non-prescription medications, including herbals, vitamins, non-steroidal anti-inflammatory drugs (NSAIDs) and supplements.  This website has more information on Xarelto: VisitDestination.com.br.

## 2017-02-09 NOTE — Anesthesia Postprocedure Evaluation (Addendum)
Anesthesia Post Note  Patient: Jerome Kent  Procedure(s) Performed: Procedure(s) (LRB): A-Flutter Ablation (N/A)  Patient location during evaluation: Cath Lab Anesthesia Type: MAC Level of consciousness: awake and alert Pain management: pain level controlled Vital Signs Assessment: post-procedure vital signs reviewed and stable Respiratory status: spontaneous breathing, nonlabored ventilation, respiratory function stable and patient connected to nasal cannula oxygen Cardiovascular status: stable and blood pressure returned to baseline Anesthetic complications: no       Last Vitals:  Vitals:   02/09/17 1240 02/09/17 1245  BP: (!) 149/88 128/77  Pulse: (!) 46 90  Resp: 11 16  Temp:      Last Pain:  Vitals:   02/09/17 1207  TempSrc: Temporal                 Kasheem Toner,JAMES TERRILL

## 2017-02-09 NOTE — Transfer of Care (Signed)
Immediate Anesthesia Transfer of Care Note  Patient: Jerome Kent  Procedure(s) Performed: Procedure(s): A-Flutter Ablation (N/A)  Patient Location: Cath Lab  Anesthesia Type:MAC  Level of Consciousness: awake, alert  and oriented  Airway & Oxygen Therapy: Patient Spontanous Breathing and Patient connected to nasal cannula oxygen  Post-op Assessment: Report given to RN, Post -op Vital signs reviewed and stable and Patient moving all extremities X 4  Post vital signs: Reviewed and stable  Last Vitals:  Vitals:   02/09/17 0726  BP: 127/89  Pulse: (!) 52  Resp: 20  Temp: 36.8 C    Last Pain:  Vitals:   02/09/17 0726  TempSrc: Oral         Complications: NO complications

## 2017-02-12 ENCOUNTER — Ambulatory Visit: Payer: PPO | Admitting: Nurse Practitioner

## 2017-02-22 DIAGNOSIS — G4733 Obstructive sleep apnea (adult) (pediatric): Secondary | ICD-10-CM | POA: Diagnosis not present

## 2017-02-22 DIAGNOSIS — E782 Mixed hyperlipidemia: Secondary | ICD-10-CM | POA: Diagnosis not present

## 2017-02-22 DIAGNOSIS — I429 Cardiomyopathy, unspecified: Secondary | ICD-10-CM | POA: Diagnosis not present

## 2017-02-22 DIAGNOSIS — E6609 Other obesity due to excess calories: Secondary | ICD-10-CM | POA: Diagnosis not present

## 2017-02-22 DIAGNOSIS — N529 Male erectile dysfunction, unspecified: Secondary | ICD-10-CM | POA: Diagnosis not present

## 2017-02-22 DIAGNOSIS — I4892 Unspecified atrial flutter: Secondary | ICD-10-CM | POA: Diagnosis not present

## 2017-03-06 DIAGNOSIS — G4733 Obstructive sleep apnea (adult) (pediatric): Secondary | ICD-10-CM | POA: Diagnosis not present

## 2017-03-07 ENCOUNTER — Encounter: Payer: Self-pay | Admitting: Cardiology

## 2017-03-08 DIAGNOSIS — G4733 Obstructive sleep apnea (adult) (pediatric): Secondary | ICD-10-CM | POA: Diagnosis not present

## 2017-03-15 ENCOUNTER — Encounter: Payer: Self-pay | Admitting: Cardiology

## 2017-03-15 ENCOUNTER — Encounter (INDEPENDENT_AMBULATORY_CARE_PROVIDER_SITE_OTHER): Payer: Self-pay

## 2017-03-15 ENCOUNTER — Ambulatory Visit (INDEPENDENT_AMBULATORY_CARE_PROVIDER_SITE_OTHER): Payer: PPO | Admitting: Cardiology

## 2017-03-15 VITALS — BP 104/80 | HR 53 | Ht 69.0 in | Wt 296.6 lb

## 2017-03-15 DIAGNOSIS — Z9889 Other specified postprocedural states: Secondary | ICD-10-CM

## 2017-03-15 DIAGNOSIS — I429 Cardiomyopathy, unspecified: Secondary | ICD-10-CM | POA: Diagnosis not present

## 2017-03-15 DIAGNOSIS — I483 Typical atrial flutter: Secondary | ICD-10-CM | POA: Diagnosis not present

## 2017-03-15 DIAGNOSIS — Z8679 Personal history of other diseases of the circulatory system: Secondary | ICD-10-CM

## 2017-03-15 NOTE — Patient Instructions (Addendum)
Medication Instructions:    Your physician has recommended you make the following change in your medication:   1) STOP Xarelto  --- If you need a refill on your cardiac medications before your next appointment, please call your pharmacy. ---  Labwork:  None ordered  Testing/Procedures: Your physician has requested that you have an echocardiogram in 3 months. Echocardiography is a painless test that uses sound waves to create images of your heart. It provides your doctor with information about the size and shape of your heart and how well your heart's chambers and valves are working. This procedure takes approximately one hour. There are no restrictions for this procedure.  Follow-Up:  Your physician recommends that you schedule a follow-up appointment in: 4 months with Dr. Elberta Fortis (after echocardiogram is completed)  Thank you for choosing [facility]!!   Dory Horn, RN [phone]

## 2017-03-15 NOTE — Progress Notes (Signed)
Electrophysiology Office Note   Date:  03/15/2017   ID:  Jerome Kent, DOB Jun 07, 1951, MRN [id number]  PCP:  Allean Found, MD  Cardiologist:  Margot Chimes Primary Electrophysiologist:  Regan Lemming, MD    Chief Complaint  Patient presents with  . Follow-up    Post Aflutter ablation  . Shortness of Breath     History of Present Illness: Jerome Kent is a 66 y.o. male who presents today for electrophysiology evaluation.   He has a history of obstructive sleep apnea on CPAP, hyperlipidemia, atrial flutter, and heart failure with prior EF of 15%. Cardiac cath in 2014 showed no coronary disease. He presented to the office on 1/15 feeling dizzy,with shortness of breath on exertion. He was found to be in atrial flutter and subsequent had a TEE and cardioversion. Ablation for atrial flutter 02/09/17. He has been more short of breath over the last few weeks. He says that his shortness of breath sometimes associated with exertion, but also can happen when he is at rest. He does not get chest pain associated with shortness of breath. He is currently on optimal medical therapy for his heart failure with lisinopril and metoprolol.  Today, he denies symptoms of palpitations, chest pain, orthopnea, PND, lower extremity edema, claudication, dizziness, presyncope, syncope, bleeding, or neurologic sequela. The patient is tolerating medications without difficulties and is otherwise without complaint today.    Past Medical History:  Diagnosis Date  . Acute systolic heart failure (HCC)   . Atrial flutter (HCC)   . Cardiomyopathy (HCC)   . Chronic systolic heart failure (HCC)    EF 15% on TEE 9/14 during cardioversion  . Dysrhythmia    atrial flutter  . Erectile dysfunction   . Hyperlipidemia   . Obesity   . OSA (obstructive sleep apnea)   . Osteoarthritis   . Shortness of breath   . Sleep apnea    Past Surgical History:  Procedure Laterality Date  . A-FLUTTER  ABLATION N/A 02/09/2017   Procedure: A-Flutter Ablation;  Surgeon: Keary Hanak Jorja Loa, MD;  [facility];  Service: Cardiovascular;  Laterality: N/A;  . APPENDECTOMY    . CARDIOVERSION N/A 09/15/2013   Procedure: CARDIOVERSION;  Surgeon: [name], MD;  Location: [facility] ENDOSCOPY;  Service: Cardiovascular;  Laterality: N/A;  . CARDIOVERSION N/A 01/11/2017   Procedure: CARDIOVERSION;  Surgeon: Jake Bathe, MD;  Location: [facility] ENDOSCOPY;  Service: Cardiovascular;  Laterality: N/A;  . LEFT HEART CATHETERIZATION WITH CORONARY ANGIOGRAM N/A 10/29/2013   Procedure: LEFT HEART CATHETERIZATION WITH CORONARY ANGIOGRAM;  Surgeon: [name], MD;  Location: [facility] CATH LAB;  Service: Cardiovascular;  Laterality: N/A;  . TEE WITHOUT CARDIOVERSION N/A 09/15/2013   Procedure: TRANSESOPHAGEAL ECHOCARDIOGRAM (TEE);  Surgeon: [name], MD;  Location: [facility] ENDOSCOPY;  Service: Cardiovascular;  Laterality: N/A;  . TEE WITHOUT CARDIOVERSION N/A 01/11/2017   Procedure: TRANSESOPHAGEAL ECHOCARDIOGRAM (TEE);  Surgeon: Jake Bathe, MD;  Location: [facility] ENDOSCOPY;  Service: Cardiovascular;  Laterality: N/A;  . TOTAL HIP ARTHROPLASTY Right 2012     Current Outpatient Prescriptions  Medication Sig Dispense Refill  . amoxicillin (AMOXIL) 500 MG capsule Take 2,000 mg by mouth. 1 hour before procedures for Dentist    . atorvastatin (LIPITOR) 40 MG tablet Take 1 tablet (40 mg total) by mouth daily. 90 tablet 3  . furosemide (LASIX) 20 MG tablet Take 20 mg by mouth daily.    [name] Kitchen ibuprofen (ADVIL,MOTRIN) 200 MG tablet Take 400 mg by mouth daily as needed  for headache or moderate pain.    [name] Kitchen lisinopril (PRINIVIL,ZESTRIL) 5 MG tablet TAKE 1 TABLET BY MOUTH EVERY DAY (PLEASE CALL AND SCHEDULE 6 MONTH FOLLOW UP APPOINTMENT) 90 tablet 2  . metoprolol succinate (TOPROL-XL) 25 MG 24 hr tablet Take 1 tablet (25 mg total) by mouth daily. 30 tablet 11   No current facility-administered medications for this visit.      Allergies:   Patient has no known allergies.   Social History:  The patient  reports that he quit smoking about 22 years ago. He has never used smokeless tobacco. He reports that he drinks about 3.6 oz of alcohol per week . He reports that he does not use drugs.   Family History:  The patient's family history includes Cancer in his father; Diabetes in his brother and father; Hypertension in his mother; Stroke in his father.    ROS:  Please see the history of present illness.   Otherwise, review of systems is positive for snoring.   All other systems are reviewed and negative.    PHYSICAL EXAM: VS:  BP 104/80   Pulse (!) 53   Ht 5\' 9"  (1.753 m)   Wt 296 lb 9.6 oz (134.5 kg)   BMI 43.80 kg/m  , BMI Body mass index is 43.8 kg/m. GEN: Well nourished, well developed, in no acute distress  HEENT: normal  Neck: no JVD, carotid bruits, or masses Cardiac: RRR; no murmurs, rubs, or gallops,no edema  Respiratory:  clear to auscultation bilaterally, normal work of breathing GI: soft, nontender, nondistended, + BS MS: no deformity or atrophy  Skin: warm and dry Neuro:  Strength and sensation are intact Psych: euthymic mood, full affect  EKG:  EKG is ordered today. Personal review of the ekg ordered shows sinus rhythm, rate 55  Recent Labs: 02/02/2017: BUN 18; Creatinine, Ser 0.70; Platelets 179; Potassium 4.8; Sodium 141    Lipid Panel  No results found for: CHOL, TRIG, HDL, CHOLHDL, VLDL, LDLCALC, LDLDIRECT   Wt Readings from Last 3 Encounters:  03/15/17 296 lb 9.6 oz (134.5 kg)  02/09/17 273 lb (123.8 kg)  01/23/17 274 lb 9.6 oz (124.6 kg)      Other studies Reviewed: Additional studies/ records that were reviewed today include: TEE 01/12/16  Review of the above records today demonstrates:  - Left ventricle: The cavity size was normal. Wall thickness was   normal. Systolic function was moderately to severely reduced. The   estimated ejection fraction was in the range of  30% to 35%.   Diffuse hypokinesis. - Aortic valve: No evidence of vegetation. - Mitral valve: No evidence of vegetation. There was trivial   regurgitation. - Left atrium: No evidence of thrombus in the appendage. No   evidence of thrombus in the appendage. - Right atrium: No evidence of thrombus in the atrial cavity or   appendage. - Atrial septum: No defect or patent foramen ovale was identified. - Tricuspid valve: No evidence of vegetation. - Pulmonic valve: No evidence of vegetation. - Superior vena cava: The study excluded a thrombus.   ASSESSMENT AND PLAN:  1.  Atrial flutter, typical: on Xarelto. Had ablation 02/09/17. He is in sinus rhythm today. We'll plan to stop his anticoagulation.  2. 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 EF was 30-35% on TEE. Currently he has in sinus rhythm. We Okema Rollinson get an echo in 4 months to reassess his ejection fraction. He is short of breath, and this could be related to his  heart failure, though he does not appear to be volume overloaded at this time. We'll continue current management.   3. Obesity: Encouraged weight loss  4. Sleep apnea: Had recent sleep study and qualified for BiPAP.   Current medicines are reviewed at length with the patient today.   The patient does not have concerns regarding his medicines.  The following changes were made today:  none  Labs/ tests ordered today include:  Orders Placed This Encounter  Procedures  . EKG 12-Lead     Disposition:   FU with Latese Dufault 4 months  Signed, Darely Becknell Jorja Loa, MD  03/15/2017 10:29 AM     [facility] [address] [place] [address] [phone] (office) [phone] (fax)

## 2017-03-26 ENCOUNTER — Encounter: Payer: Self-pay | Admitting: Cardiology

## 2017-04-09 NOTE — Progress Notes (Signed)
Cardiology Office Note    Date:  04/10/2017   ID:  Jerome Kent, DOB 1951-11-14, MRN [id number]  PCP:  Allean Found, MD  Cardiologist:   [name], MD     History of Present Illness:  Jerome Kent is a 66 y.o. male post ablation of atrial flutter on 02/09/17 with prior ejection fraction of 15%, cardiac cath in 2014 showing no CAD here for follow-up. Dr. Elberta Fortis note reviewed from 03/15/17. He also had a cardioversion on 01/11/17 prior to his ablation.  Post ablation, is maintaining sinus rhythm. 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 We will see what his echocardiogram shows an 4 months.  Sleep apnea qualify for BiPAP/ CPAP.  Overall he is feeling okay. NYHA class II symptoms. Still having some shortness of breath with exertion. He has hired someone to do yard work. Continue to encourage movement, activity. He seems to be maintaining sinus rhythm. No chest pain, no syncopal, no bleeding, no orthopnea.       Past Medical History:  Diagnosis Date  . Acute systolic heart failure (HCC)   . Atrial flutter (HCC)   . Cardiomyopathy (HCC)   . Chronic systolic heart failure (HCC)    EF 15% on TEE 9/14 during cardioversion  . Dysrhythmia    atrial flutter  . Erectile dysfunction   . Hyperlipidemia   . Obesity   . OSA (obstructive sleep apnea)   . Osteoarthritis   . Shortness of breath   . Sleep apnea     Past Surgical History:  Procedure Laterality Date  . A-FLUTTER ABLATION N/A 02/09/2017   Procedure: A-Flutter Ablation;  Surgeon: Will Jorja Loa, MD;  Location: [facility] INVASIVE CV LAB;  Service: Cardiovascular;  Laterality: N/A;  . APPENDECTOMY    . CARDIOVERSION N/A 09/15/2013   Procedure: CARDIOVERSION;  Surgeon: [name], MD;  Location: [facility] ENDOSCOPY;  Service: Cardiovascular;  Laterality: N/A;  . CARDIOVERSION N/A 01/11/2017   Procedure:  CARDIOVERSION;  Surgeon: Jake Bathe, MD;  Location: [facility] ENDOSCOPY;  Service: Cardiovascular;  Laterality: N/A;  . LEFT HEART CATHETERIZATION WITH CORONARY ANGIOGRAM N/A 10/29/2013   Procedure: LEFT HEART CATHETERIZATION WITH CORONARY ANGIOGRAM;  Surgeon: [name], MD;  Location: [facility] CATH LAB;  Service: Cardiovascular;  Laterality: N/A;  . TEE WITHOUT CARDIOVERSION N/A 09/15/2013   Procedure: TRANSESOPHAGEAL ECHOCARDIOGRAM (TEE);  Surgeon: [name], MD;  Location: [facility] ENDOSCOPY;  Service: Cardiovascular;  Laterality: N/A;  . TEE WITHOUT CARDIOVERSION N/A 01/11/2017   Procedure: TRANSESOPHAGEAL ECHOCARDIOGRAM (TEE);  Surgeon: Jake Bathe, MD;  Location: [facility] ENDOSCOPY;  Service: Cardiovascular;  Laterality: N/A;  . TOTAL HIP ARTHROPLASTY Right 2012    Current Medications: Outpatient Medications Prior to Visit  Medication Sig Dispense Refill  . amoxicillin (AMOXIL) 500 MG capsule Take 2,000 mg by mouth. 1 hour before procedures for Dentist    . atorvastatin (LIPITOR) 40 MG tablet Take 1 tablet (40 mg total) by mouth daily. 90 tablet 3  . furosemide (LASIX) 20 MG tablet Take 20 mg by mouth daily.    [name] Kitchen ibuprofen (ADVIL,MOTRIN) 200 MG tablet Take 400 mg by mouth daily as needed for headache or moderate pain.    [name] Kitchen lisinopril (PRINIVIL,ZESTRIL) 5 MG tablet TAKE 1 TABLET BY MOUTH EVERY DAY (PLEASE CALL AND SCHEDULE 6 MONTH FOLLOW UP APPOINTMENT) 90 tablet 2  . metoprolol succinate (TOPROL-XL) 25 MG 24 hr tablet Take 1 tablet (25 mg  total) by mouth daily. 30 tablet 11   No facility-administered medications prior to visit.      Allergies:   Patient has no known allergies.   Social History   Social History  . Marital status: Married    Spouse name: N/A  . Number of children: N/A  . Years of education: N/A   Social History Main Topics  . Smoking status: Former Smoker    Quit date: 12/25/1994  . Smokeless tobacco: Never Used  . Alcohol use 3.6 oz/week    6 Cans of beer per week  . Drug use:  No  . Sexual activity: Yes   Other Topics Concern  . None   Social History Narrative  . None     Family History:  The patient's family history includes Cancer in his father; Diabetes in his brother and father; Hypertension in his mother; Stroke in his father.   ROS:   Please see the history of present illness.    ROS All other systems reviewed and are negative.   PHYSICAL EXAM:   VS:  BP 110/70   Pulse 63   Ht 5\' 9"  (1.753 m)   Wt 280 lb (127 kg)   SpO2 97%   BMI 41.35 kg/m    GEN: Well nourished, well developed, in no acute distress  HEENT: normal  Neck: no JVD, carotid bruits, or masses Cardiac: brady reg; no murmurs, rubs, or gallops,no edema  Respiratory:  clear to auscultation bilaterally, normal work of breathing GI: soft, nontender, nondistended, + BS, obese MS: no deformity or atrophy  Skin: warm and dry, no rash Neuro:  Alert and Oriented x 3, Strength and sensation are intact Psych: euthymic mood, full affect  Wt Readings from Last 3 Encounters:  04/10/17 280 lb (127 kg)  03/15/17 296 lb 9.6 oz (134.5 kg)  02/09/17 273 lb (123.8 kg)      Studies/Labs Reviewed:   EKG:  EKG is not ordered today  Recent Labs: 02/02/2017: BUN 18; Creatinine, Ser 0.70; Platelets 179; Potassium 4.8; Sodium 141   Lipid Panel No results found for: CHOL, TRIG, HDL, CHOLHDL, VLDL, LDLCALC, LDLDIRECT  Additional studies/ records that were reviewed today include:  Prior echocardiogram, lab work, office reviewed.    ASSESSMENT:    1. Chronic systolic heart failure (HCC)   2. Dilated cardiomyopathy (HCC)   3. Typical atrial flutter (HCC)      PLAN:  In order of problems listed above:  Atrial flutter  - Post ablation, Dr. Elberta Fortis. Typical flutter.  - Maintaining sinus rhythm.  - Mild bradycardia - no room to increase beta blocker  - Off anticoagulation  Nonischemic cardio myopathy/chronic systolic heart failure  - In part may be tachycardia mediated partially. His  EF was as low as 15% at one point, 35% now on most recent assessment.  - We will be rechecking an echocardiogram in the future.  - Lisinopril 5mg , BP soft, no room for increase today  Sleep apnea  - BiPAP/ CPAP, working with Dr. Earl Gala and home care agency.  Morbid Obesity  - Continue to encourage weight loss. Exercise.     Medication Adjustments/Labs and Tests Ordered: Current medicines are reviewed at length with the patient today.  Concerns regarding medicines are outlined above.  Medication changes, Labs and Tests ordered today are listed in the Patient Instructions below. Patient Instructions  Medication Instructions:  The current medical regimen is effective;  continue present plan and medications.  Follow-Up: Follow up in 4 months  with Dr Anne Fu.  If you need a refill on your cardiac medications before your next appointment, please call your pharmacy.  Thank you for choosing [facility]!!        Signed, [name], MD  04/10/2017 8:29 AM    [facility] [address] [place], [place], [place]  [address] Phone: [phone]; Fax: [phone]

## 2017-04-10 ENCOUNTER — Ambulatory Visit (INDEPENDENT_AMBULATORY_CARE_PROVIDER_SITE_OTHER): Payer: PPO | Admitting: Cardiology

## 2017-04-10 ENCOUNTER — Encounter: Payer: Self-pay | Admitting: Cardiology

## 2017-04-10 VITALS — BP 110/70 | HR 63 | Ht 69.0 in | Wt 280.0 lb

## 2017-04-10 DIAGNOSIS — I483 Typical atrial flutter: Secondary | ICD-10-CM | POA: Diagnosis not present

## 2017-04-10 DIAGNOSIS — I420 Dilated cardiomyopathy: Secondary | ICD-10-CM | POA: Diagnosis not present

## 2017-04-10 DIAGNOSIS — I5022 Chronic systolic (congestive) heart failure: Secondary | ICD-10-CM | POA: Diagnosis not present

## 2017-04-10 NOTE — Patient Instructions (Signed)
Medication Instructions:  The current medical regimen is effective;  continue present plan and medications.  Follow-Up: Follow up in 4 months with Dr Skains.  If you need a refill on your cardiac medications before your next appointment, please call your pharmacy.  Thank you for choosing [facility]!!     

## 2017-04-11 DIAGNOSIS — G4733 Obstructive sleep apnea (adult) (pediatric): Secondary | ICD-10-CM | POA: Diagnosis not present

## 2017-05-11 DIAGNOSIS — G4733 Obstructive sleep apnea (adult) (pediatric): Secondary | ICD-10-CM | POA: Diagnosis not present

## 2017-05-14 DIAGNOSIS — G4733 Obstructive sleep apnea (adult) (pediatric): Secondary | ICD-10-CM | POA: Diagnosis not present

## 2017-05-21 ENCOUNTER — Other Ambulatory Visit: Payer: Self-pay | Admitting: Cardiology

## 2017-05-25 NOTE — Addendum Note (Signed)
Addendum  created 05/25/17 1140 by Sharee Holster, MD   Sign clinical note

## 2017-05-28 DIAGNOSIS — G4733 Obstructive sleep apnea (adult) (pediatric): Secondary | ICD-10-CM | POA: Diagnosis not present

## 2017-06-01 NOTE — Addendum Note (Signed)
Addendum  created 06/01/17 0925 by Bryauna Byrum, MD   Sign clinical note    

## 2017-06-11 DIAGNOSIS — G4733 Obstructive sleep apnea (adult) (pediatric): Secondary | ICD-10-CM | POA: Diagnosis not present

## 2017-06-15 ENCOUNTER — Ambulatory Visit (HOSPITAL_COMMUNITY): Payer: PPO | Attending: Cardiovascular Disease

## 2017-06-15 ENCOUNTER — Other Ambulatory Visit: Payer: Self-pay

## 2017-06-15 DIAGNOSIS — E669 Obesity, unspecified: Secondary | ICD-10-CM | POA: Insufficient documentation

## 2017-06-15 DIAGNOSIS — I509 Heart failure, unspecified: Secondary | ICD-10-CM | POA: Insufficient documentation

## 2017-06-15 DIAGNOSIS — I429 Cardiomyopathy, unspecified: Secondary | ICD-10-CM | POA: Diagnosis not present

## 2017-06-15 DIAGNOSIS — I080 Rheumatic disorders of both mitral and aortic valves: Secondary | ICD-10-CM | POA: Insufficient documentation

## 2017-06-15 DIAGNOSIS — Z6841 Body Mass Index (BMI) 40.0 and over, adult: Secondary | ICD-10-CM | POA: Diagnosis not present

## 2017-06-15 DIAGNOSIS — E785 Hyperlipidemia, unspecified: Secondary | ICD-10-CM | POA: Diagnosis not present

## 2017-06-15 DIAGNOSIS — Z87891 Personal history of nicotine dependence: Secondary | ICD-10-CM | POA: Insufficient documentation

## 2017-06-15 DIAGNOSIS — Z8249 Family history of ischemic heart disease and other diseases of the circulatory system: Secondary | ICD-10-CM | POA: Insufficient documentation

## 2017-06-15 DIAGNOSIS — G4733 Obstructive sleep apnea (adult) (pediatric): Secondary | ICD-10-CM | POA: Diagnosis not present

## 2017-06-20 ENCOUNTER — Encounter: Payer: Self-pay | Admitting: Cardiology

## 2017-06-20 NOTE — Telephone Encounter (Signed)
°  Follow Up  Pt is calling to follow up on echocardiogram results. Please call.

## 2017-06-20 NOTE — Telephone Encounter (Signed)
This encounter was created in error - please disregard.

## 2017-07-11 DIAGNOSIS — G4733 Obstructive sleep apnea (adult) (pediatric): Secondary | ICD-10-CM | POA: Diagnosis not present

## 2017-07-19 DIAGNOSIS — H2513 Age-related nuclear cataract, bilateral: Secondary | ICD-10-CM | POA: Diagnosis not present

## 2017-07-19 DIAGNOSIS — H524 Presbyopia: Secondary | ICD-10-CM | POA: Diagnosis not present

## 2017-08-11 DIAGNOSIS — G4733 Obstructive sleep apnea (adult) (pediatric): Secondary | ICD-10-CM | POA: Diagnosis not present

## 2017-08-20 ENCOUNTER — Encounter: Payer: Self-pay | Admitting: Cardiology

## 2017-08-20 ENCOUNTER — Ambulatory Visit (INDEPENDENT_AMBULATORY_CARE_PROVIDER_SITE_OTHER): Payer: PPO | Admitting: Cardiology

## 2017-08-20 VITALS — BP 114/78 | HR 62 | Ht 69.0 in | Wt 284.2 lb

## 2017-08-20 DIAGNOSIS — I4892 Unspecified atrial flutter: Secondary | ICD-10-CM | POA: Diagnosis not present

## 2017-08-20 DIAGNOSIS — Z8679 Personal history of other diseases of the circulatory system: Secondary | ICD-10-CM | POA: Diagnosis not present

## 2017-08-20 DIAGNOSIS — I420 Dilated cardiomyopathy: Secondary | ICD-10-CM

## 2017-08-20 DIAGNOSIS — Z9889 Other specified postprocedural states: Secondary | ICD-10-CM

## 2017-08-20 DIAGNOSIS — I5022 Chronic systolic (congestive) heart failure: Secondary | ICD-10-CM

## 2017-08-20 NOTE — Patient Instructions (Signed)
  Medication Instructions:  The current medical regimen is effective;  continue present plan and medications.  Follow-Up: Your physician recommends that you schedule a follow-up appointment in: 1 yr.  Please call the office to schedule about approximately 10 months.  If you need a refill on your cardiac medications before your next appointment, please call your pharmacy.  Thank you for choosing [facility]!!

## 2017-08-20 NOTE — Progress Notes (Signed)
Cardiology Office Note    Date:  08/20/2017   ID:  Jerome Kent, DOB 1951/04/06, MRN [id number]  PCP:  Merri Brunette, MD  Cardiologist:   [name], MD     History of Present Illness:  Jerome Kent is a 66 y.o. male post ablation of atrial flutter on 02/09/17 with prior ejection fraction of 15%, cardiac cath in 2014 showing no CAD here for follow-up. Dr. Elberta Fortis note reviewed from 03/15/17. He also had a cardioversion on 01/11/17 prior to his ablation.  Post ablation, is maintaining sinus rhythm. 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 Sure enough, EF has normalized.   Sleep apnea qualify for BiPAP/ CPAP.  Overall doing quite well, no shortness of breath, no syncope, no bleeding, no orthopnea. Still working with weight loss. He is very pleased with his improvement in ejection fraction.    Past Medical History:  Diagnosis Date  . Acute systolic heart failure (HCC)   . Atrial flutter (HCC)   . Cardiomyopathy (HCC)   . Chronic systolic heart failure (HCC)    EF 15% on TEE 9/14 during cardioversion  . Dysrhythmia    atrial flutter  . Erectile dysfunction   . Hyperlipidemia   . Obesity   . OSA (obstructive sleep apnea)   . Osteoarthritis   . Shortness of breath   . Sleep apnea     Past Surgical History:  Procedure Laterality Date  . A-FLUTTER ABLATION N/A 02/09/2017   Procedure: A-Flutter Ablation;  Surgeon: Will Jorja Loa, MD;  [facility] LAB;  Service: Cardiovascular;  Laterality: N/A;  . APPENDECTOMY    . CARDIOVERSION N/A 09/15/2013   Procedure: CARDIOVERSION;  Surgeon: [name], MD;  Location: [facility] ENDOSCOPY;  Service: Cardiovascular;  Laterality: N/A;  . CARDIOVERSION N/A 01/11/2017   Procedure: CARDIOVERSION;  Surgeon: Jake Bathe, MD;  Location: [facility] ENDOSCOPY;  Service: Cardiovascular;  Laterality: N/A;  . LEFT HEART  CATHETERIZATION WITH CORONARY ANGIOGRAM N/A 10/29/2013   Procedure: LEFT HEART CATHETERIZATION WITH CORONARY ANGIOGRAM;  Surgeon: [name], MD;  Location: [facility] CATH LAB;  Service: Cardiovascular;  Laterality: N/A;  . TEE WITHOUT CARDIOVERSION N/A 09/15/2013   Procedure: TRANSESOPHAGEAL ECHOCARDIOGRAM (TEE);  Surgeon: [name], MD;  Location: [facility] ENDOSCOPY;  Service: Cardiovascular;  Laterality: N/A;  . TEE WITHOUT CARDIOVERSION N/A 01/11/2017   Procedure: TRANSESOPHAGEAL ECHOCARDIOGRAM (TEE);  Surgeon: Jake Bathe, MD;  Location: [facility] ENDOSCOPY;  Service: Cardiovascular;  Laterality: N/A;  . TOTAL HIP ARTHROPLASTY Right 2012    Current Medications: Outpatient Medications Prior to Visit  Medication Sig Dispense Refill  . amoxicillin (AMOXIL) 500 MG capsule Take 2,000 mg by mouth. 1 hour before procedures for Dentist    . atorvastatin (LIPITOR) 40 MG tablet Take 1 tablet (40 mg total) by mouth daily. 90 tablet 3  . furosemide (LASIX) 20 MG tablet Take 20 mg by mouth daily.    [name] Kitchen ibuprofen (ADVIL,MOTRIN) 200 MG tablet Take 400 mg by mouth daily as needed for headache or moderate pain.    [name] Kitchen lisinopril (PRINIVIL,ZESTRIL) 5 MG tablet TAKE 1 TABLET BY MOUTH EVERY DAY (PLEASE CALL AND SCHEDULE 6 MONTH FOLLOW UP APPOINTMENT) 90 tablet 2  . metoprolol succinate (TOPROL-XL) 25 MG 24 hr tablet Take 1 tablet (25 mg total) by mouth daily. 90 tablet 3  . metoprolol succinate (TOPROL-XL) 25 MG 24 hr tablet Take 1 tablet (25 mg total) by  mouth daily. 30 tablet 11   No facility-administered medications prior to visit.      Allergies:   Patient has no known allergies.   Social History   Social History  . Marital status: Married    Spouse name: N/A  . Number of children: N/A  . Years of education: N/A   Social History Main Topics  . Smoking status: Former Smoker    Quit date: 12/25/1994  . Smokeless tobacco: Never Used  . Alcohol use 3.6 oz/week    6 Cans of beer per week  . Drug use: No  . Sexual  activity: Yes   Other Topics Concern  . None   Social History Narrative  . None     Family History:  The patient's family history includes Cancer in his father; Diabetes in his brother and father; Heart attack in his unknown relative; Hypertension in his mother; Stroke in his father.   ROS:   Please see the history of present illness.    ROS All other systems reviewed and are negative.   PHYSICAL EXAM:   VS:  BP 114/78   Pulse 62   Ht 5\' 9"  (1.753 m)   Wt 284 lb 3.2 oz (128.9 kg)   SpO2 94%   BMI 41.97 kg/m    GEN: Well nourished, well developed, in no acute distress obese HEENT: normal  Neck: no JVD, carotid bruits, or masses Cardiac: RRR; no murmurs, rubs, or gallops,no edema  Respiratory:  clear to auscultation bilaterally, normal work of breathing GI: soft, nontender, nondistended, + BS MS: no deformity or atrophy  Skin: warm and dry, no rash Neuro:  Alert and Oriented x 3, Strength and sensation are intact Psych: euthymic mood, full affect   Wt Readings from Last 3 Encounters:  08/20/17 284 lb 3.2 oz (128.9 kg)  04/10/17 280 lb (127 kg)  03/15/17 296 lb 9.6 oz (134.5 kg)      Studies/Labs Reviewed:   EKG:  EKG is not ordered today  Recent Labs: 02/02/2017: BUN 18; Creatinine, Ser 0.70; Hemoglobin 14.9; Platelets 179; Potassium 4.8; Sodium 141   Lipid Panel No results found for: CHOL, TRIG, HDL, CHOLHDL, VLDL, LDLCALC, LDLDIRECT  LDL 87  Additional studies/ records that were reviewed today include:  Prior echocardiogram, lab work, office reviewed.  ECHO 06/15/17 - Left ventricle: The cavity size was normal. Wall thickness was   normal. Systolic function was normal. The estimated ejection   fraction was in the range of 60% to 65%. Wall motion was normal;   there were no regional wall motion abnormalities. Features are   consistent with a pseudonormal left ventricular filling pattern,   with concomitant abnormal relaxation and increased filling    pressure (grade 2 diastolic dysfunction). - Aortic valve: Mildly calcified annulus. - Mitral valve: There was mild regurgitation.   ASSESSMENT:    1. Chronic systolic heart failure (HCC)   2. Atrial flutter, unspecified type (HCC)   3. Dilated cardiomyopathy (HCC)   4. S/P ablation of atrial flutter   5. Morbid obesity (HCC)      PLAN:  In order of problems listed above:  Atrial flutter  - Post ablation, Dr. Elberta Fortis. Typical flutter.  - Maintaining sinus rhythm.  - Excellent return of normal ejection fraction.  - Off anticoagulation  Nonischemic cardio myopathy/chronic systolic heart failure  - Resolved post ablation  - Likely tachycardia mediated. His EF was as low as 15% at one point, 60% now on most recent assessment.  -  Lisinopril 5mg , And low-dose Toprol. We will continue since his cardiomyopathy has improved.  Sleep apnea  - BiPAP/ CPAP, working with Dr. Earl Gala and home care agency. Still compliant with this.  Morbid Obesity  - Continue to encourage weight loss. Exercise. He admits that he has trouble with this. Continue to encourage.    Medication Adjustments/Labs and Tests Ordered: Current medicines are reviewed at length with the patient today.  Concerns regarding medicines are outlined above.  Medication changes, Labs and Tests ordered today are listed in the Patient Instructions below. Patient Instructions   Medication Instructions:  The current medical regimen is effective;  continue present plan and medications.  Follow-Up: Your physician recommends that you schedule a follow-up appointment in: 1 yr.  Please call the office to schedule about approximately 10 months.  If you need a refill on your cardiac medications before your next appointment, please call your pharmacy.  Thank you for choosing [facility]!!        Signed, [name], MD  08/20/2017 8:19 AM    [facility] [address] [place], [place], [place]   [address] Phone: [phone]; Fax: [phone]

## 2017-09-01 ENCOUNTER — Other Ambulatory Visit: Payer: Self-pay | Admitting: Cardiology

## 2017-09-11 DIAGNOSIS — G4733 Obstructive sleep apnea (adult) (pediatric): Secondary | ICD-10-CM | POA: Diagnosis not present

## 2017-09-19 DIAGNOSIS — G4733 Obstructive sleep apnea (adult) (pediatric): Secondary | ICD-10-CM | POA: Diagnosis not present

## 2017-10-11 DIAGNOSIS — G4733 Obstructive sleep apnea (adult) (pediatric): Secondary | ICD-10-CM | POA: Diagnosis not present

## 2017-10-22 DIAGNOSIS — G4733 Obstructive sleep apnea (adult) (pediatric): Secondary | ICD-10-CM | POA: Diagnosis not present

## 2017-11-11 DIAGNOSIS — G4733 Obstructive sleep apnea (adult) (pediatric): Secondary | ICD-10-CM | POA: Diagnosis not present

## 2017-11-28 DIAGNOSIS — Z6841 Body Mass Index (BMI) 40.0 and over, adult: Secondary | ICD-10-CM | POA: Diagnosis not present

## 2017-11-28 DIAGNOSIS — E782 Mixed hyperlipidemia: Secondary | ICD-10-CM | POA: Diagnosis not present

## 2017-11-28 DIAGNOSIS — I429 Cardiomyopathy, unspecified: Secondary | ICD-10-CM | POA: Diagnosis not present

## 2017-11-28 DIAGNOSIS — I5022 Chronic systolic (congestive) heart failure: Secondary | ICD-10-CM | POA: Diagnosis not present

## 2017-11-28 DIAGNOSIS — I4892 Unspecified atrial flutter: Secondary | ICD-10-CM | POA: Diagnosis not present

## 2017-11-28 DIAGNOSIS — E6609 Other obesity due to excess calories: Secondary | ICD-10-CM | POA: Diagnosis not present

## 2017-11-28 DIAGNOSIS — R21 Rash and other nonspecific skin eruption: Secondary | ICD-10-CM | POA: Diagnosis not present

## 2017-11-29 ENCOUNTER — Other Ambulatory Visit: Payer: Self-pay | Admitting: *Deleted

## 2017-11-29 DIAGNOSIS — I5022 Chronic systolic (congestive) heart failure: Secondary | ICD-10-CM

## 2017-11-29 MED ORDER — FUROSEMIDE 20 MG PO TABS: 20.0000 mg | ORAL_TABLET | Freq: Every day | ORAL | 2 refills | Status: DC

## 2017-12-11 DIAGNOSIS — G4733 Obstructive sleep apnea (adult) (pediatric): Secondary | ICD-10-CM | POA: Diagnosis not present

## 2018-01-11 DIAGNOSIS — G4733 Obstructive sleep apnea (adult) (pediatric): Secondary | ICD-10-CM | POA: Diagnosis not present

## 2018-05-03 ENCOUNTER — Encounter: Payer: Self-pay | Admitting: Cardiology

## 2018-05-03 ENCOUNTER — Ambulatory Visit: Payer: PPO | Admitting: Cardiology

## 2018-05-03 VITALS — BP 124/86 | HR 58 | Ht 69.0 in | Wt 294.4 lb

## 2018-05-03 DIAGNOSIS — I4892 Unspecified atrial flutter: Secondary | ICD-10-CM

## 2018-05-03 DIAGNOSIS — Z8679 Personal history of other diseases of the circulatory system: Secondary | ICD-10-CM

## 2018-05-03 DIAGNOSIS — Z9889 Other specified postprocedural states: Secondary | ICD-10-CM | POA: Diagnosis not present

## 2018-05-03 NOTE — Patient Instructions (Signed)

## 2018-05-03 NOTE — Progress Notes (Signed)
Cardiology Office Note    Date:  05/03/2018   ID:  Jerome Kent, DOB 31-Jan-1951, MRN [id number]  PCP:  Merri Brunette, MD  Cardiologist:   [name], MD     History of Present Illness:  Jerome Kent is a 67 y.o. male post ablation of atrial flutter on 02/09/17 with prior ejection fraction of 15%, cardiac cath in 2014 showing no CAD here for follow-up. Dr. Elberta Fortis note reviewed from 03/15/17. He also had a cardioversion on 01/11/17 prior to his ablation.  Post ablation, is maintaining sinus rhythm. There was a thought that his nonischemic cardiomyopathy may have been partially tachycardia mediated as his EF was slightly improved on TEE 30-35%. Sure enough, EF has normalized.   Sleep apnea qualify for BiPAP/ CPAP.  Overall doing quite well, no shortness of breath, no syncope, no bleeding, no orthopnea. Still working with weight loss. He is very pleased with his improvement in ejection fraction.  05/03/2018-continues to do very well.  Very pleased.  Thankful.  No chest pain fevers chills nausea vomiting shortness of breath.  Still struggling with weight.  Compliant with medications.    Past Medical History:  Diagnosis Date  . Acute systolic heart failure (HCC)   . Atrial flutter (HCC)   . Cardiomyopathy (HCC)   . Chronic systolic heart failure (HCC)    EF 15% on TEE 9/14 during cardioversion  . Dysrhythmia    atrial flutter  . Erectile dysfunction   . Hyperlipidemia   . Obesity   . OSA (obstructive sleep apnea)   . Osteoarthritis   . Shortness of breath   . Sleep apnea     Past Surgical History:  Procedure Laterality Date  . A-FLUTTER ABLATION N/A 02/09/2017   Procedure: A-Flutter Ablation;  Surgeon: Will Jorja Loa, MD;  [facility]: [facility] INVASIVE CV LAB;  Service: Cardiovascular;  Laterality: N/A;  . APPENDECTOMY    . CARDIOVERSION N/A 09/15/2013   Procedure: CARDIOVERSION;  Surgeon: [name], MD;  Location: [facility] ENDOSCOPY;  Service: Cardiovascular;  Laterality:  N/A;  . CARDIOVERSION N/A 01/11/2017   Procedure: CARDIOVERSION;  Surgeon: Jake Bathe, MD;  Location: [facility] ENDOSCOPY;  Service: Cardiovascular;  Laterality: N/A;  . LEFT HEART CATHETERIZATION WITH CORONARY ANGIOGRAM N/A 10/29/2013   Procedure: LEFT HEART CATHETERIZATION WITH CORONARY ANGIOGRAM;  Surgeon: [name], MD;  Location: [facility] CATH LAB;  Service: Cardiovascular;  Laterality: N/A;  . TEE WITHOUT CARDIOVERSION N/A 09/15/2013   Procedure: TRANSESOPHAGEAL ECHOCARDIOGRAM (TEE);  Surgeon: [name], MD;  Location: [facility] ENDOSCOPY;  Service: Cardiovascular;  Laterality: N/A;  . TEE WITHOUT CARDIOVERSION N/A 01/11/2017   Procedure: TRANSESOPHAGEAL ECHOCARDIOGRAM (TEE);  Surgeon: Jake Bathe, MD;  Location: [facility] ENDOSCOPY;  Service: Cardiovascular;  Laterality: N/A;  . TOTAL HIP ARTHROPLASTY Right 2012    Current Medications: Outpatient Medications Prior to Visit  Medication Sig Dispense Refill  . amoxicillin (AMOXIL) 500 MG capsule Take 2,000 mg by mouth. 1 hour before procedures for Dentist    . atorvastatin (LIPITOR) 40 MG tablet Take 1 tablet (40 mg total) by mouth daily. 90 tablet 3  . furosemide (LASIX) 20 MG tablet Take 1 tablet (20 mg total) by mouth daily. 90 tablet 2  . ibuprofen (ADVIL,MOTRIN) 200 MG tablet Take 400 mg by mouth daily as needed for headache or moderate pain.    [name] Kitchen lisinopril (PRINIVIL,ZESTRIL) 5 MG tablet Take 1 tablet (5 mg total) by mouth daily. 90 tablet 3  . metoprolol succinate (TOPROL-XL) 25 MG 24 hr tablet Take 1  tablet (25 mg total) by mouth daily. 90 tablet 3   No facility-administered medications prior to visit.      Allergies:   Patient has no known allergies.   Social History   Socioeconomic History  . Marital status: Married    Spouse name: Not on file  . Number of children: Not on file  . Years of education: Not on file  . Highest education level: Not on file  Occupational History  . Not on file  Social Needs  . Financial resource strain: Not  on file  . Food insecurity:    Worry: Not on file    Inability: Not on file  . Transportation needs:    Medical: Not on file    Non-medical: Not on file  Tobacco Use  . Smoking status: Former Smoker    Last attempt to quit: 12/25/1994    Years since quitting: 23.3  . Smokeless tobacco: Never Used  Substance and Sexual Activity  . Alcohol use: Yes    Alcohol/week: 3.6 oz    Types: 6 Cans of beer per week  . Drug use: No  . Sexual activity: Yes  Lifestyle  . Physical activity:    Days per week: Not on file    Minutes per session: Not on file  . Stress: Not on file  Relationships  . Social connections:    Talks on phone: Not on file    Gets together: Not on file    Attends religious service: Not on file    Active member of club or organization: Not on file    Attends meetings of clubs or organizations: Not on file    Relationship status: Not on file  Other Topics Concern  . Not on file  Social History Narrative  . Not on file     Family History:  The patient's family history includes Cancer in his father; Diabetes in his brother and father; Heart attack in his unknown relative; Hypertension in his mother; Stroke in his father.   ROS:   Please see the history of present illness.    Review of Systems  All other systems reviewed and are negative.     PHYSICAL EXAM:   VS:  BP 124/86   Pulse (!) 58   Ht 5\' 9"  (1.753 m)   Wt 294 lb 6.4 oz (133.5 kg)   BMI 43.48 kg/m    GEN: Well nourished, well developed, in no acute distress obese HEENT: normal  Neck: no JVD, carotid bruits, or masses Cardiac: RRR; no murmurs, rubs, or gallops,no edema  Respiratory:  clear to auscultation bilaterally, normal work of breathing GI: soft, nontender, nondistended, + BS MS: no deformity or atrophy  Skin: warm and dry, no rash Neuro:  Alert and Oriented x 3, Strength and sensation are intact Psych: euthymic mood, full affect    Wt Readings from Last 3 Encounters:  05/03/18 294 lb  6.4 oz (133.5 kg)  08/20/17 284 lb 3.2 oz (128.9 kg)  04/10/17 280 lb (127 kg)      Studies/Labs Reviewed:   EKG: 05/03/2018-sinus bradycardia rate 58 with no other significant abnormalities.  Personally viewed.  Recent Labs: No results found for requested labs within last 8760 hours.   Lipid Panel No results found for: CHOL, TRIG, HDL, CHOLHDL, VLDL, LDLCALC, LDLDIRECT  LDL 87  Additional studies/ records that were reviewed today include:  Prior echocardiogram, lab work, office reviewed.  ECHO 06/15/17 - Left ventricle: The cavity size was normal. Wall  thickness was   normal. Systolic function was normal. The estimated ejection   fraction was in the range of 60% to 65%. Wall motion was normal;   there were no regional wall motion abnormalities. Features are   consistent with a pseudonormal left ventricular filling pattern,   with concomitant abnormal relaxation and increased filling   pressure (grade 2 diastolic dysfunction). - Aortic valve: Mildly calcified annulus. - Mitral valve: There was mild regurgitation.   ASSESSMENT:    1. S/P ablation of atrial flutter   2. Atrial flutter, unspecified type (HCC)   3. Morbid obesity (HCC)      PLAN:  In order of problems listed above:  Atrial flutter  - Post ablation, Dr. Elberta Fortis. Typical flutter.  - Maintaining sinus rhythm.  - Excellent return of normal ejection fraction.  Component of tachycardia mediated cardiomyopathy.  - Off anticoagulation, excellent.  Nonischemic cardiomyopathy/chronic systolic heart failure  - Resolved post ablation  - Likely tachycardia mediated. His EF was as low as 15% at one point, 60% now on most recent assessment.  -Continuing with both beta-blocker and ACE inhibitor with previous underlying cardiomyopathy.  Low doses.  Sleep apnea  - BiPAP/ CPAP, working with Dr. Earl Gala and home care agency. Still compliant with this.  Doing well with this.  Morbid Obesity  - Continue to encourage  weight loss. Exercise. He admits that he has trouble with this. Continue to encourage.  We once again discussed today.  He was up again 10 pounds.  Stressed the importance of weight loss especially in maintenance of his atrial arrhythmia or lack thereof.    Medication Adjustments/Labs and Tests Ordered: Current medicines are reviewed at length with the patient today.  Concerns regarding medicines are outlined above.  Medication changes, Labs and Tests ordered today are listed in the Patient Instructions below. Patient Instructions  Medication Instructions:  The current medical regimen is effective;  continue present plan and medications.  Follow-Up: Follow up in 1 year with Dr. Anne Fu.  You will receive a letter in the mail 2 months before you are due.  Please call us when you receive this letter to schedule your follow up appointment.  If you need a refill on your cardiac medications before your next appointment, please call your pharmacy.  Thank you for choosing [facility]!!        Signed, [name], MD  05/03/2018 9:21 AM    [facility] [address] [place], [place], [place]  [address] Phone: [phone]; Fax: [phone]

## 2018-05-22 ENCOUNTER — Other Ambulatory Visit: Payer: Self-pay | Admitting: Cardiology

## 2018-05-27 DIAGNOSIS — G4733 Obstructive sleep apnea (adult) (pediatric): Secondary | ICD-10-CM | POA: Diagnosis not present

## 2018-05-29 DIAGNOSIS — G4733 Obstructive sleep apnea (adult) (pediatric): Secondary | ICD-10-CM | POA: Diagnosis not present

## 2018-06-03 DIAGNOSIS — E7800 Pure hypercholesterolemia, unspecified: Secondary | ICD-10-CM | POA: Diagnosis not present

## 2018-06-03 DIAGNOSIS — S39012A Strain of muscle, fascia and tendon of lower back, initial encounter: Secondary | ICD-10-CM | POA: Diagnosis not present

## 2018-08-19 DIAGNOSIS — H5213 Myopia, bilateral: Secondary | ICD-10-CM | POA: Diagnosis not present

## 2018-08-19 DIAGNOSIS — H524 Presbyopia: Secondary | ICD-10-CM | POA: Diagnosis not present

## 2018-08-20 ENCOUNTER — Ambulatory Visit: Payer: PPO | Admitting: Cardiology

## 2018-09-09 ENCOUNTER — Other Ambulatory Visit: Payer: Self-pay | Admitting: Cardiology

## 2018-09-09 DIAGNOSIS — I5022 Chronic systolic (congestive) heart failure: Secondary | ICD-10-CM

## 2018-09-10 ENCOUNTER — Other Ambulatory Visit: Payer: Self-pay | Admitting: Cardiology

## 2018-09-16 ENCOUNTER — Observation Stay (HOSPITAL_COMMUNITY)
Admission: EM | Admit: 2018-09-16 | Discharge: 2018-09-17 | Disposition: A | Discharge status: Home / Self Care | Payer: PPO | Attending: Cardiology | Admitting: Cardiology

## 2018-09-16 ENCOUNTER — Emergency Department (HOSPITAL_COMMUNITY): Payer: PPO

## 2018-09-16 ENCOUNTER — Telehealth: Payer: Self-pay | Admitting: Cardiology

## 2018-09-16 ENCOUNTER — Encounter (HOSPITAL_COMMUNITY): Payer: Self-pay | Admitting: *Deleted

## 2018-09-16 ENCOUNTER — Other Ambulatory Visit: Payer: Self-pay

## 2018-09-16 DIAGNOSIS — Z79899 Other long term (current) drug therapy: Secondary | ICD-10-CM | POA: Diagnosis not present

## 2018-09-16 DIAGNOSIS — I200 Unstable angina: Principal | ICD-10-CM | POA: Diagnosis present

## 2018-09-16 DIAGNOSIS — K769 Liver disease, unspecified: Secondary | ICD-10-CM | POA: Diagnosis not present

## 2018-09-16 DIAGNOSIS — I429 Cardiomyopathy, unspecified: Secondary | ICD-10-CM | POA: Diagnosis not present

## 2018-09-16 DIAGNOSIS — M1990 Unspecified osteoarthritis, unspecified site: Secondary | ICD-10-CM | POA: Diagnosis not present

## 2018-09-16 DIAGNOSIS — Z955 Presence of coronary angioplasty implant and graft: Secondary | ICD-10-CM | POA: Insufficient documentation

## 2018-09-16 DIAGNOSIS — I5022 Chronic systolic (congestive) heart failure: Secondary | ICD-10-CM | POA: Insufficient documentation

## 2018-09-16 DIAGNOSIS — E669 Obesity, unspecified: Secondary | ICD-10-CM | POA: Diagnosis present

## 2018-09-16 DIAGNOSIS — G4733 Obstructive sleep apnea (adult) (pediatric): Secondary | ICD-10-CM | POA: Insufficient documentation

## 2018-09-16 DIAGNOSIS — R109 Unspecified abdominal pain: Secondary | ICD-10-CM

## 2018-09-16 DIAGNOSIS — Z823 Family history of stroke: Secondary | ICD-10-CM | POA: Insufficient documentation

## 2018-09-16 DIAGNOSIS — Z9889 Other specified postprocedural states: Secondary | ICD-10-CM | POA: Insufficient documentation

## 2018-09-16 DIAGNOSIS — Z8249 Family history of ischemic heart disease and other diseases of the circulatory system: Secondary | ICD-10-CM | POA: Diagnosis not present

## 2018-09-16 DIAGNOSIS — E785 Hyperlipidemia, unspecified: Secondary | ICD-10-CM

## 2018-09-16 DIAGNOSIS — I480 Paroxysmal atrial fibrillation: Secondary | ICD-10-CM | POA: Diagnosis not present

## 2018-09-16 DIAGNOSIS — Z87891 Personal history of nicotine dependence: Secondary | ICD-10-CM | POA: Diagnosis not present

## 2018-09-16 DIAGNOSIS — I4891 Unspecified atrial fibrillation: Secondary | ICD-10-CM

## 2018-09-16 DIAGNOSIS — R002 Palpitations: Secondary | ICD-10-CM | POA: Diagnosis not present

## 2018-09-16 DIAGNOSIS — R001 Bradycardia, unspecified: Secondary | ICD-10-CM | POA: Diagnosis not present

## 2018-09-16 DIAGNOSIS — Z833 Family history of diabetes mellitus: Secondary | ICD-10-CM | POA: Diagnosis not present

## 2018-09-16 DIAGNOSIS — Z6841 Body Mass Index (BMI) 40.0 and over, adult: Secondary | ICD-10-CM | POA: Insufficient documentation

## 2018-09-16 DIAGNOSIS — Z7982 Long term (current) use of aspirin: Secondary | ICD-10-CM | POA: Insufficient documentation

## 2018-09-16 DIAGNOSIS — Z96641 Presence of right artificial hip joint: Secondary | ICD-10-CM | POA: Insufficient documentation

## 2018-09-16 DIAGNOSIS — R5383 Other fatigue: Secondary | ICD-10-CM | POA: Diagnosis not present

## 2018-09-16 DIAGNOSIS — N529 Male erectile dysfunction, unspecified: Secondary | ICD-10-CM | POA: Insufficient documentation

## 2018-09-16 DIAGNOSIS — R079 Chest pain, unspecified: Secondary | ICD-10-CM | POA: Diagnosis not present

## 2018-09-16 LAB — CBC
HCT: 46.9 % (ref 39.0–52.0)
Hemoglobin: 14.9 g/dL (ref 13.0–17.0)
MCH: 30.4 pg (ref 26.0–34.0)
MCHC: 31.8 g/dL (ref 30.0–36.0)
MCV: 95.7 fL (ref 78.0–100.0)
Platelets: 201 10*3/uL (ref 150–400)
RBC: 4.9 MIL/uL (ref 4.22–5.81)
RDW: 13 % (ref 11.5–15.5)
WBC: 6.7 10*3/uL (ref 4.0–10.5)

## 2018-09-16 LAB — HEPATIC FUNCTION PANEL
ALT: 27 U/L (ref 0–44)
AST: 21 U/L (ref 15–41)
Albumin: 3.7 g/dL (ref 3.5–5.0)
Alkaline Phosphatase: 69 U/L (ref 38–126)
Bilirubin, Direct: 0.2 mg/dL (ref 0.0–0.2)
Indirect Bilirubin: 0.4 mg/dL (ref 0.3–0.9)
Total Bilirubin: 0.6 mg/dL (ref 0.3–1.2)
Total Protein: 7 g/dL (ref 6.5–8.1)

## 2018-09-16 LAB — APTT: aPTT: 26 seconds (ref 24–36)

## 2018-09-16 LAB — BASIC METABOLIC PANEL
Anion gap: 10 (ref 5–15)
BUN: 16 mg/dL (ref 8–23)
CO2: 28 mmol/L (ref 22–32)
Calcium: 9 mg/dL (ref 8.9–10.3)
Chloride: 101 mmol/L (ref 98–111)
Creatinine, Ser: 0.91 mg/dL (ref 0.61–1.24)
GFR calc Af Amer: >60 mL/min (ref >60)
GFR calc non Af Amer: >60 mL/min (ref >60)
Glucose, Bld: 131 mg/dL — ABNORMAL HIGH (ref 70–99)
Potassium: 4.1 mmol/L (ref 3.5–5.1)
Sodium: 139 mmol/L (ref 135–145)

## 2018-09-16 LAB — TSH: TSH: 2.468 u[IU]/mL (ref 0.350–4.500)

## 2018-09-16 LAB — PROTIME-INR
INR: 0.94
Prothrombin Time: 12.5 seconds (ref 11.4–15.2)

## 2018-09-16 LAB — I-STAT TROPONIN, ED: Troponin i, poc: 0.01 ng/mL (ref 0.00–0.08)

## 2018-09-16 LAB — HEMOGLOBIN A1C
Hgb A1c MFr Bld: 5.8 % — ABNORMAL HIGH (ref 4.8–5.6)
Mean Plasma Glucose: 119.76 mg/dL

## 2018-09-16 LAB — T4, FREE: Free T4: 0.89 ng/dL (ref 0.82–1.77)

## 2018-09-16 LAB — TROPONIN I
Troponin I: <0.03 ng/mL (ref <0.03)
Troponin I: <0.03 ng/mL (ref <0.03)

## 2018-09-16 LAB — LIPASE, BLOOD: Lipase: 37 U/L (ref 11–51)

## 2018-09-16 MED ORDER — ZOLPIDEM TARTRATE 5 MG PO TABS
5.0000 mg | ORAL_TABLET | Freq: Every evening | ORAL | Status: DC | PRN
  Administered 2018-09-16: 5 mg via ORAL
  Filled 2018-09-16: qty 1

## 2018-09-16 MED ORDER — ASPIRIN 81 MG PO CHEW
81.0000 mg | CHEWABLE_TABLET | ORAL | Status: AC
  Administered 2018-09-17: 81 mg via ORAL
  Filled 2018-09-16: qty 1

## 2018-09-16 MED ORDER — ACETAMINOPHEN 325 MG PO TABS
650.0000 mg | ORAL_TABLET | ORAL | Status: DC | PRN
  Administered 2018-09-16: 650 mg via ORAL
  Filled 2018-09-16: qty 2

## 2018-09-16 MED ORDER — HEPARIN (PORCINE) IN NACL 100-0.45 UNIT/ML-% IJ SOLN: 12.0000 [IU]/kg/h | INTRAMUSCULAR | Status: DC

## 2018-09-16 MED ORDER — ASPIRIN 81 MG PO CHEW: 324.0000 mg | CHEWABLE_TABLET | ORAL | Status: DC

## 2018-09-16 MED ORDER — ASPIRIN EC 81 MG PO TBEC
81.0000 mg | DELAYED_RELEASE_TABLET | Freq: Every day | ORAL | Status: DC
  Filled 2018-09-16: qty 1

## 2018-09-16 MED ORDER — SODIUM CHLORIDE 0.9 % WEIGHT BASED INFUSION
1.0000 mL/kg/h | INTRAVENOUS | Status: DC
  Administered 2018-09-17: 1 mL/kg/h via INTRAVENOUS

## 2018-09-16 MED ORDER — HEPARIN BOLUS VIA INFUSION
4000.0000 [IU] | Freq: Once | INTRAVENOUS | Status: AC
  Administered 2018-09-16: 4000 [IU] via INTRAVENOUS
  Filled 2018-09-16: qty 4000

## 2018-09-16 MED ORDER — NITROGLYCERIN 0.4 MG SL SUBL: 0.4000 mg | SUBLINGUAL_TABLET | SUBLINGUAL | Status: DC | PRN

## 2018-09-16 MED ORDER — SODIUM CHLORIDE 0.9% FLUSH: 3.0000 mL | Freq: Two times a day (BID) | INTRAVENOUS | Status: DC

## 2018-09-16 MED ORDER — SODIUM CHLORIDE 0.9% FLUSH: 3.0000 mL | INTRAVENOUS | Status: DC | PRN

## 2018-09-16 MED ORDER — ASPIRIN 300 MG RE SUPP: 300.0000 mg | RECTAL | Status: DC

## 2018-09-16 MED ORDER — SODIUM CHLORIDE 0.9 % IV SOLN: 250.0000 mL | INTRAVENOUS | Status: DC | PRN

## 2018-09-16 MED ORDER — SODIUM CHLORIDE 0.9 % WEIGHT BASED INFUSION
3.0000 mL/kg/h | INTRAVENOUS | Status: DC
  Administered 2018-09-17: 3 mL/kg/h via INTRAVENOUS

## 2018-09-16 MED ORDER — ASPIRIN 81 MG PO CHEW
324.0000 mg | CHEWABLE_TABLET | Freq: Once | ORAL | Status: AC
  Administered 2018-09-16: 324 mg via ORAL
  Filled 2018-09-16: qty 4

## 2018-09-16 MED ORDER — ONDANSETRON HCL 4 MG/2ML IJ SOLN: 4.0000 mg | Freq: Four times a day (QID) | INTRAMUSCULAR | Status: DC | PRN

## 2018-09-16 MED ORDER — HEPARIN (PORCINE) IN NACL 100-0.45 UNIT/ML-% IJ SOLN
1400.0000 [IU]/h | INTRAMUSCULAR | Status: DC
  Administered 2018-09-16: 1300 [IU]/h via INTRAVENOUS
  Filled 2018-09-16 (×2): qty 250

## 2018-09-16 MED ORDER — METOPROLOL SUCCINATE ER 25 MG PO TB24
25.0000 mg | ORAL_TABLET | Freq: Every day | ORAL | Status: DC
  Administered 2018-09-17: 25 mg via ORAL
  Filled 2018-09-16: qty 1

## 2018-09-16 MED ORDER — HEPARIN SODIUM (PORCINE) 5000 UNIT/ML IJ SOLN: 60.0000 [IU]/kg | Freq: Once | INTRAMUSCULAR | Status: DC

## 2018-09-16 MED ORDER — ATORVASTATIN CALCIUM 40 MG PO TABS
40.0000 mg | ORAL_TABLET | Freq: Every day | ORAL | Status: DC
  Administered 2018-09-17: 40 mg via ORAL
  Filled 2018-09-16: qty 1

## 2018-09-16 NOTE — Progress Notes (Deleted)
Cardiology Office Note    Date:  09/16/2018   ID:  Jerome Kent, DOB [birth year], MRN [id number]  PCP:  Merri Brunette, MD  Cardiologist: No primary care provider on file. EPS: None  No chief complaint on file.   History of Present Illness:  Jerome Kent is a 67 y.o. male with history of atrial flutter status post cardioversion followed by ablation ablation 02/09/2017, nonischemic cardiomyopathy previous ejection fraction 15% which has since normalized on echo 05/2017, no CAD on cardiac catheterization 2014, sleep apnea on CPAP, morbid obesity.  Patient last saw Dr. Anne Fu 04/2018 which time he was doing well.  Patient called in yesterday complaining of feeling like somebody punched him in the chest and woke him from sleep.  He was also dizzy and nauseous.  He did not feel like his heart was racing or out of rhythm.  An appointment was made for him to see me today but then he proceeded to the emergency room yesterday.  Labs normal, troponins negative EKG normal sinus rhythm with nonspecific ST-T wave changes, no acute change, chest x-ray without acute disease.  Past Medical History:  Diagnosis Date  . Acute systolic heart failure (HCC)   . Atrial flutter (HCC)   . Cardiomyopathy (HCC)   . Chronic systolic heart failure (HCC)    EF 15% on TEE 9/14 during cardioversion  . Dysrhythmia    atrial flutter  . Erectile dysfunction   . Hyperlipidemia   . Obesity   . OSA (obstructive sleep apnea)   . Osteoarthritis   . Shortness of breath   . Sleep apnea     Past Surgical History:  Procedure Laterality Date  . A-FLUTTER ABLATION N/A 02/09/2017   Procedure: A-Flutter Ablation;  Surgeon: Will Jorja Loa, MD;  [facility]: [facility] INVASIVE CV LAB;  Service: Cardiovascular;  Laterality: N/A;  . APPENDECTOMY    . CARDIOVERSION N/A 09/15/2013   Procedure: CARDIOVERSION;  Surgeon: [name], MD;  Location: [facility] ENDOSCOPY;  Service: Cardiovascular;  Laterality: N/A;  . CARDIOVERSION N/A  01/11/2017   Procedure: CARDIOVERSION;  Surgeon: Jake Bathe, MD;  Location: [facility] ENDOSCOPY;  Service: Cardiovascular;  Laterality: N/A;  . LEFT HEART CATHETERIZATION WITH CORONARY ANGIOGRAM N/A 10/29/2013   Procedure: LEFT HEART CATHETERIZATION WITH CORONARY ANGIOGRAM;  Surgeon: [name], MD;  Location: [facility] CATH LAB;  Service: Cardiovascular;  Laterality: N/A;  . TEE WITHOUT CARDIOVERSION N/A 09/15/2013   Procedure: TRANSESOPHAGEAL ECHOCARDIOGRAM (TEE);  Surgeon: [name], MD;  Location: [facility] ENDOSCOPY;  Service: Cardiovascular;  Laterality: N/A;  . TEE WITHOUT CARDIOVERSION N/A 01/11/2017   Procedure: TRANSESOPHAGEAL ECHOCARDIOGRAM (TEE);  Surgeon: Jake Bathe, MD;  Location: [facility] ENDOSCOPY;  Service: Cardiovascular;  Laterality: N/A;  . TOTAL HIP ARTHROPLASTY Right 2012    Current Medications: No outpatient medications have been marked as taking for the 09/17/18 encounter (Appointment) with Dyann Kief, PA-C.     Allergies:   Patient has no known allergies.   Social History   Socioeconomic History  . Marital status: Married    Spouse name: Not on file  . Number of children: Not on file  . Years of education: Not on file  . Highest education level: Not on file  Occupational History  . Not on file  Social Needs  . Financial resource strain: Not on file  . Food insecurity:    Worry: Not on file    Inability: Not on file  . Transportation needs:    Medical: Not on file  Non-medical: Not on file  Tobacco Use  . Smoking status: Former Smoker    Last attempt to quit: 12/25/1994    Years since quitting: 23.7  . Smokeless tobacco: Never Used  Substance and Sexual Activity  . Alcohol use: Yes    Alcohol/week: 6.0 standard drinks    Types: 6 Cans of beer per week  . Drug use: No  . Sexual activity: Yes  Lifestyle  . Physical activity:    Days per week: Not on file    Minutes per session: Not on file  . Stress: Not on file  Relationships  . Social connections:    Talks  on phone: Not on file    Gets together: Not on file    Attends religious service: Not on file    Active member of club or organization: Not on file    Attends meetings of clubs or organizations: Not on file    Relationship status: Not on file  Other Topics Concern  . Not on file  Social History Narrative  . Not on file     Family History:  The patient's family history includes Cancer in his father; Diabetes in his brother and father; Heart attack in his unknown relative; Hypertension in his mother; Stroke in his father.   ROS:   Please see the history of present illness.    ROS All other systems reviewed and are negative.   PHYSICAL EXAM:   VS:  There were no vitals taken for this visit.  Physical Exam  GEN: Well nourished, well developed, in no acute distress  HEENT: normal  Neck: no JVD, carotid bruits, or masses Cardiac:RRR; no murmurs, rubs, or gallops  Respiratory:  clear to auscultation bilaterally, normal work of breathing GI: soft, nontender, nondistended, + BS Ext: without cyanosis, clubbing, or edema, Good distal pulses bilaterally MS: no deformity or atrophy  Skin: warm and dry, no rash Neuro:  Alert and Oriented x 3, Strength and sensation are intact Psych: euthymic mood, full affect  Wt Readings from Last 3 Encounters:  05/03/18 294 lb 6.4 oz (133.5 kg)  08/20/17 284 lb 3.2 oz (128.9 kg)  04/10/17 280 lb (127 kg)      Studies/Labs Reviewed:   EKG:  EKG is*** ordered today.  The ekg ordered today demonstrates ***  Recent Labs: 09/16/2018: BUN 16; Creatinine, Ser 0.91; Hemoglobin 14.9; Platelets 201; Potassium 4.1; Sodium 139   Lipid Panel No results found for: CHOL, TRIG, HDL, CHOLHDL, VLDL, LDLCALC, LDLDIRECT  Additional studies/ records that were reviewed today include:  ***     ASSESSMENT:    1. Typical atrial flutter (HCC)   2. Other cardiomyopathy (HCC)   3. OSA (obstructive sleep apnea)      PLAN:  In order of problems listed  above:  History of atrial flutter status post cardioversion followed by ablation 02/09/2017 has maintained normal sinus rhythm since then  Nonischemic cardiomyopathy felt to be tachycardia mediated ejection fraction as low as 15% normalized on echo 05/2017, no CAD on cath 2014  Obstructive sleep apnea on CPAP  Morbid obesity  Medication Adjustments/Labs and Tests Ordered: Current medicines are reviewed at length with the patient today.  Concerns regarding medicines are outlined above.  Medication changes, Labs and Tests ordered today are listed in the Patient Instructions below. There are no Patient Instructions on file for this visit.   Signed, Jacolyn Reedy, PA-C  09/16/2018 2:54 PM    [facility] [address] [place],  [place], [place]  [address] Phone: [phone]; Fax: [phone]

## 2018-09-16 NOTE — Telephone Encounter (Signed)
New Message   Pt c/o of Chest Pain: STAT if CP now or developed within 24 hours  1. Are you having CP right now? Some but it worse overnight    2. Are you experiencing any other symptoms (ex. SOB, nausea, vomiting, sweating)? nausea  3. How long have you been experiencing CP? Since last night   4. Is your CP continuous or coming and going? Continous  5. Have you taken Nitroglycerin? No    ?

## 2018-09-16 NOTE — Telephone Encounter (Signed)
Patient reports he had a "bad night." He had an episode of sudden CP that woke him up. Mr. Allsup stated, "It felt like someone punched me in the chest." He said it took several minutes for the pain to subside to go back to sleep. During the episode, he said he was slightly dizzy and slightly nauseous. "Nothing too bad."  He said he never felt like his heart was racing.  This morning, he has no CP. He just feels fatigued. His heart does not feel out of rhythm.  He has no vital signs to report. Scheduled the patient for evaluation tomorrow morning for evaluation. The patient understands to seek immediate medical attention if CP returns or symptoms return prior to that time. He was grateful for assistance.

## 2018-09-16 NOTE — Progress Notes (Signed)
Pt set up with auto settings for CPAP for the night.  Pt tolerating well with M/L nasal mask.  No issues noted at time of set up.

## 2018-09-16 NOTE — H&P (Addendum)
Cardiology Admission History and Physical:   Patient ID: Jerome Kent MRN: [id number]; DOB: [birth year]   Admission date: 09/16/2018  Primary Care Provider: Merri Brunette, MD Primary Cardiologist: [name], MD  Primary Electrophysiologist:  None   Chief Complaint: Chest pain  Patient Profile:   Jerome Kent is a 67 y.o. male with prior non-ischemic cardiomyopathy (resolved EF in 2018), prior cardiac catheterization in 2014 showing no significant coronary artery disease here with chest pain concerning for unstable angina.  History of Present Illness:   Jerome Kent is a 67 year old patient of mine who back in 2014 was diagnosed with nonischemic cardiomyopathy EF 15%, cath at that time showed no CAD who ended up developing atrial flutter and had ablation performed on 02/09/2017 with Dr. Elberta Fortis.  He is also treated for sleep apnea with BiPAP/CPAP.  At last office visit he was doing quite well, working on weight loss but pleased with his improvement in ejection fraction up to 60 to 65% on echocardiogram 06/15/2017.  He came into the ER today after yesterday started to develop severe mid epigastric chest discomfort that occurred when he was laying down going to bed.  No unusual foods.  This pain was quite severe,, he got out of bed, felt some nausea diaphoresis.  After about 2 hours or so it went away.  He did take an aspirin to help and see if it relieved it.  The following day, today, ended up having another episode not associated with food or exertion that was concerning to him.  At this time, he and his wife decided to come to the emergency room for further evaluation.  He has been feeling some increasing shortness of breath with activity, mild orthopnea.   He thought at one point he may have been out of rhythm but clearly he is in normal sinus rhythm here in the emergency department.  Seems to be quite anxious about this.  When asked, he still has his gallbladder in place.  He  denies any recent fevers chills syncope bleeding.  No melena.  No acid reflux sensation.  He has had some mild dizziness.   Past Medical History:  Diagnosis Date  . Acute systolic heart failure (HCC)   . Atrial flutter (HCC)   . Cardiomyopathy (HCC)   . Chronic systolic heart failure (HCC)    EF 15% on TEE 9/14 during cardioversion  . Dysrhythmia    atrial flutter  . Erectile dysfunction   . Hyperlipidemia   . Obesity   . OSA (obstructive sleep apnea)   . Osteoarthritis   . Shortness of breath   . Sleep apnea     Past Surgical History:  Procedure Laterality Date  . A-FLUTTER ABLATION N/A 02/09/2017   Procedure: A-Flutter Ablation;  Surgeon: Will Jorja Loa, MD;  Location: [facility] INVASIVE CV LAB;  Service: Cardiovascular;  Laterality: N/A;  . APPENDECTOMY    . CARDIOVERSION N/A 09/15/2013   Procedure: CARDIOVERSION;  Surgeon: [name], MD;  Location: [facility] ENDOSCOPY;  Service: Cardiovascular;  Laterality: N/A;  . CARDIOVERSION N/A 01/11/2017   Procedure: CARDIOVERSION;  Surgeon: Jake Bathe, MD;  Location: [facility] ENDOSCOPY;  Service: Cardiovascular;  Laterality: N/A;  . LEFT HEART CATHETERIZATION WITH CORONARY ANGIOGRAM N/A 10/29/2013   Procedure: LEFT HEART CATHETERIZATION WITH CORONARY ANGIOGRAM;  Surgeon: [name], MD;  Location: [facility] CATH LAB;  Service: Cardiovascular;  Laterality: N/A;  . TEE WITHOUT CARDIOVERSION N/A 09/15/2013   Procedure: TRANSESOPHAGEAL ECHOCARDIOGRAM (TEE);  Surgeon: [name], MD;  Location: [facility] ENDOSCOPY;  Service: Cardiovascular;  Laterality: N/A;  . TEE WITHOUT CARDIOVERSION N/A 01/11/2017   Procedure: TRANSESOPHAGEAL ECHOCARDIOGRAM (TEE);  Surgeon: Jake Bathe, MD;  Location: [facility] ENDOSCOPY;  Service: Cardiovascular;  Laterality: N/A;  . TOTAL HIP ARTHROPLASTY Right 2012     Medications Prior to Admission: Prior to Admission medications   Medication Sig Start Date End Date Taking? Authorizing Provider  amoxicillin (AMOXIL) 500 MG capsule Take 2,000  mg by mouth See admin instructions. Take 2,000 mg by mouth one hour prior to dental procedures 09/25/13  Yes [provider]  aspirin EC 81 MG tablet Take 81 mg by mouth daily.   Yes [provider]  atorvastatin (LIPITOR) 40 MG tablet Take 1 tablet (40 mg total) by mouth daily. 11/24/15  Yes Jake Bathe, MD  Cyanocobalamin (B-12 PO) Take 1 tablet by mouth daily.   Yes [provider]  furosemide (LASIX) 20 MG tablet TAKE 1 TABLET BY MOUTH EVERY DAY Patient taking differently: Take 20 mg by mouth daily.  09/09/18  Yes Jake Bathe, MD  ibuprofen (ADVIL,MOTRIN) 200 MG tablet Take 400 mg by mouth daily as needed for headache or moderate pain.   Yes [provider]  lisinopril (PRINIVIL,ZESTRIL) 5 MG tablet TAKE 1 TABLET BY MOUTH EVERY DAY 09/10/18  Yes Jake Bathe, MD  metoprolol succinate (TOPROL-XL) 25 MG 24 hr tablet TAKE 1 TABLET BY MOUTH EVERY DAY Patient taking differently: Take 25 mg by mouth daily.  05/22/18  Yes Jake Bathe, MD  PRESCRIPTION MEDICATION CPAP: At bedtime only   Yes [provider]     Allergies:   No Known Allergies  Social History:   Social History   Socioeconomic History  . Marital status: Married    Spouse name: Not on file  . Number of children: Not on file  . Years of education: Not on file  . Highest education level: Not on file  Occupational History  . Not on file  Social Needs  . Financial resource strain: Not on file  . Food insecurity:    Worry: Not on file    Inability: Not on file  . Transportation needs:    Medical: Not on file    Non-medical: Not on file  Tobacco Use  . Smoking status: Former Smoker    Last attempt to quit: 12/25/1994    Years since quitting: 23.7  . Smokeless tobacco: Never Used  Substance and Sexual Activity  . Alcohol use: Yes    Alcohol/week: 6.0 standard drinks    Types: 6 Cans of beer per week  . Drug use: No  . Sexual activity: Yes  Lifestyle  . Physical  activity:    Days per week: Not on file    Minutes per session: Not on file  . Stress: Not on file  Relationships  . Social connections:    Talks on phone: Not on file    Gets together: Not on file    Attends religious service: Not on file    Active member of club or organization: Not on file    Attends meetings of clubs or organizations: Not on file    Relationship status: Not on file  . Intimate partner violence:    Fear of current or ex partner: Not on file    Emotionally abused: Not on file    Physically abused: Not on file    Forced sexual activity: Not on file  Other Topics Concern  . Not on file  Social History Narrative  . Not on file    Family History:   The patient's family history includes Cancer in his father; Diabetes in his brother and father; Heart attack in his unknown relative; Hypertension in his mother; Stroke in his father.    ROS:  Please see the history of present illness.  All other ROS reviewed and negative.     Physical Exam/Data:   Vitals:   09/16/18 1221 09/16/18 1425 09/16/18 1600  BP: (!) 151/95 126/82   Pulse: (!) 57 64   Resp: 20 18   Temp: 98 F (36.7 C)    TempSrc: Oral    SpO2: 100% 98%   Weight:   133.4 kg  Height:   5\' 9"  (1.753 m)   No intake or output data in the 24 hours ending 09/16/18 1648 Filed Weights   09/16/18 1600  Weight: 133.4 kg   Body mass index is 43.42 kg/m.  General:  Well nourished, well developed, in no acute distress, overweight HEENT: normal Lymph: no adenopathy Neck: no JVD Endocrine:  No thryomegaly Vascular: No carotid bruits; FA pulses 2+ bilaterally without bruits  Cardiac:  normal S1, S2; RRR; no murmur  Lungs:  clear to auscultation bilaterally, no wheezing, rhonchi or rales  Abd: soft, nontender, no hepatomegaly  Ext: Trace lower extremity edema Musculoskeletal:  No deformities, BUE and BLE strength normal and equal Skin: warm and dry  Neuro:  CNs 2-12 intact, no focal abnormalities  noted Psych:  Normal affect    EKG:  The ECG that was done  was personally reviewed and demonstrates sinus bradycardia rate 57 with nonspecific ST-T wave changes.  No significant change from prior.  Relevant CV Studies:  Echocardiogram 06/15/2017:  - Left ventricle: The cavity size was normal. Wall thickness was   normal. Systolic function was normal. The estimated ejection   fraction was in the range of 60% to 65%. Wall motion was normal;   there were no regional wall motion abnormalities. Features are   consistent with a pseudonormal left ventricular filling pattern,   with concomitant abnormal relaxation and increased filling   pressure (grade 2 diastolic dysfunction). - Aortic valve: Mildly calcified annulus. - Mitral valve: There was mild regurgitation.  Prior echocardiogram in 2014 showed EF of 15%  Cardiac catheterization 10/29/2013:  -No angiographically significant coronary artery disease  -EF 40 to 45%  -LVEDP was 12   Laboratory Data:  Chemistry Recent Labs  Lab 09/16/18 1232  NA 139  K 4.1  CL 101  CO2 28  GLUCOSE 131*  BUN 16  CREATININE 0.91  CALCIUM 9.0  GFRNONAA >60  GFRAA >60  ANIONGAP 10    No results for input(s): PROT, ALBUMIN, AST, ALT, ALKPHOS, BILITOT in the last 168 hours. Hematology Recent Labs  Lab 09/16/18 1232  WBC 6.7  RBC 4.90  HGB 14.9  HCT 46.9  MCV 95.7  MCH 30.4  MCHC 31.8  RDW 13.0  PLT 201   Cardiac EnzymesNo results for input(s): TROPONINI in the last 168 hours.  Recent Labs  Lab 09/16/18 1242  TROPIPOC 0.01    BNPNo results for input(s): BNP, PROBNP in the last 168 hours.  DDimer No results for input(s): DDIMER in the last 168 hours.  Radiology/Studies:  Dg Chest 2 View  Result Date: 09/16/2018 CLINICAL DATA:  Chest pain EXAM: CHEST - 2 VIEW COMPARISON:  02/21/2011 FINDINGS: Cardiac shadows within normal limits. Mild scarring is again seen in the left base. No focal infiltrate  or sizable effusion is noted.  No other focal abnormality is seen. IMPRESSION: No active cardiopulmonary disease. Electronically Signed   By: Alcide Clever M.D.   On: 09/16/2018 13:23    Assessment and Plan:   Unstable anginal symptoms - Thus far troponin is normal.  Currently on IV heparin.  Symptoms have progressed and increased in severity, occurring at rest.  He has the sensation that something is wrong.  Reassuringly 5 years ago he did not demonstrate any significant CAD.  Given the severity of his symptoms, nonspecific ST-T wave changes and prior history of cardiomyopathy, I think it makes sense to get a definitive answer with diagnostic angiography.  With his shortness of breath, getting a right and left heart catheterization via the radial and brachial approach makes sense.  Risks and benefits including stroke heart attack death renal impairment bleeding have been discussed.  He is willing to proceed. - If cardiac findings are reassuring, we should work-up potential GI causes, perhaps abdominal ultrasound looking for any gallbladder disease.  We will check liver enzymes and a lipase given the location and severity of his symptoms. -Continue to cycle troponin. -Chest x-ray reassuring.  Morbid obesity -Continue to encourage weight loss.  History of nonischemic cardiomyopathy -Back in 2014 his EF was as low as 15%.  Eventually this normalized with medical therapy and time. - With increasing shortness of breath, recheck echocardiogram to ensure proper structure and function.  Last year reviewed, reassuring.  Atrial flutter ablation -No further recurrence.  Continue home medications.   Severity of Illness: The appropriate patient status for this patient is OBSERVATION. Observation status is judged to be reasonable and necessary in order to provide the required intensity of service to ensure the patient's safety. The patient's presenting symptoms, physical exam findings, and initial radiographic and laboratory data in  the context of their medical condition is felt to place them at decreased risk for further clinical deterioration. Furthermore, it is anticipated that the patient will be medically stable for discharge from the hospital within 2 midnights of admission. The following factors support the patient status of observation.   " The patient's presenting symptoms include chest discomfort. " The physical exam findings include regular rate and rhythm. " The initial radiographic and laboratory data are normal chest x-ray, normal troponin.    For questions or updates, please contact [facility] Please consult [email or web address] for contact info under        Signed, [name], MD  09/16/2018 4:48 PM

## 2018-09-16 NOTE — Progress Notes (Signed)
ANTICOAGULATION CONSULT NOTE - Initial Consult  Pharmacy Consult for heparin Indication: chest pain/ACS  No Known Allergies  Patient Measurements: Weight: 294 lb (133.4 kg) Heparin Dosing Weight: 102  Vital Signs: Temp: 98 F (36.7 C) (09/23 1221) Temp Source: Oral (09/23 1221) BP: 126/82 (09/23 1425) Pulse Rate: 64 (09/23 1425)  Labs: Recent Labs    09/16/18 1232  HGB 14.9  HCT 46.9  PLT 201  CREATININE 0.91    Estimated Creatinine Clearance: 106.7 mL/min (by C-G formula based on SCr of 0.91 mg/dL).    Assessment: 67 yo M admitted with chest pain. Pharmacy consulted to dose heparin. CBC WNL.   Goal of Therapy:  Heparin level 0.3-0.7 units/ml Monitor platelets by anticoagulation protocol: Yes   Plan:  Heparin 4000 unit IV bolus, then 1300 units/hr IV infusion 6 hr Heparin level Daily heparin level, CBC, monitor for s/s of bleeding  Ewing Schlein, PharmD PGY1 Pharmacy Resident 09/16/2018    6:56 PM

## 2018-09-16 NOTE — ED Provider Notes (Signed)
[facility] EMERGENCY DEPARTMENT Provider Note   CSN: [id number] Arrival date & time: 09/16/18  1216     History   Chief Complaint Chief Complaint  Patient presents with  . Fatigue  . Palpitations    HPI Jerome Kent is a 67 y.o. male.  HPI   [name] is a 67 y.o. male, with a history of systolic heart failure, aflutter corrected with ablation, cardiomyopathy, hyperlipidemia, and obesity, presenting to the ED with chest pain beginning yesterday while at rest.  Pain is central chest, "feels like someone hit me right in the chest," worsened over the course of about 2 hours last night, 10/10, nonradiating.  Improved after aspirin last night, but recurred a couple times today as well. Accompanied by nausea, shortness of breath, and lightheadedness. Exertional shortness of breath. Patient had an ablation two years ago for aflutter. Prior to ablation, he would have episodes of the above symptoms, but without chest pain.  Patient reports clean cardiac cath in 2014. Denies fever/chills, cough, vomiting, diarrhea, syncope, abdominal pain, lower extremity edema or pain, orthopnea, or any other complaints.    Past Medical History:  Diagnosis Date  . Acute systolic heart failure (HCC)   . Atrial flutter (HCC)   . Cardiomyopathy (HCC)   . Chronic systolic heart failure (HCC)    EF 15% on TEE 9/14 during cardioversion  . Dysrhythmia    atrial flutter  . Erectile dysfunction   . Hyperlipidemia   . Obesity   . OSA (obstructive sleep apnea)   . Osteoarthritis   . Shortness of breath   . Sleep apnea     Patient Active Problem List   Diagnosis Date Noted  . OSA (obstructive sleep apnea) 09/16/2018  . Unstable angina (HCC) 09/16/2018  . Typical atrial flutter (HCC) 02/09/2017  . Chronic systolic heart failure (HCC)   . Cardiomyopathy (HCC)   . Obesity   . Atrial flutter (HCC) 09/15/2013    Past Surgical History:  Procedure Laterality Date  .  A-FLUTTER ABLATION N/A 02/09/2017   Procedure: A-Flutter Ablation;  Surgeon: Will Jorja Loa, MD;  Location: [facility] INVASIVE CV LAB;  Service: Cardiovascular;  Laterality: N/A;  . APPENDECTOMY    . CARDIOVERSION N/A 09/15/2013   Procedure: CARDIOVERSION;  Surgeon: [name], MD;  Location: [facility] ENDOSCOPY;  Service: Cardiovascular;  Laterality: N/A;  . CARDIOVERSION N/A 01/11/2017   Procedure: CARDIOVERSION;  Surgeon: Jake Bathe, MD;  Location: [facility] ENDOSCOPY;  Service: Cardiovascular;  Laterality: N/A;  . LEFT HEART CATHETERIZATION WITH CORONARY ANGIOGRAM N/A 10/29/2013   Procedure: LEFT HEART CATHETERIZATION WITH CORONARY ANGIOGRAM;  Surgeon: [name], MD;  Location: [facility] CATH LAB;  Service: Cardiovascular;  Laterality: N/A;  . TEE WITHOUT CARDIOVERSION N/A 09/15/2013   Procedure: TRANSESOPHAGEAL ECHOCARDIOGRAM (TEE);  Surgeon: [name], MD;  Location: [facility] ENDOSCOPY;  Service: Cardiovascular;  Laterality: N/A;  . TEE WITHOUT CARDIOVERSION N/A 01/11/2017   Procedure: TRANSESOPHAGEAL ECHOCARDIOGRAM (TEE);  Surgeon: Jake Bathe, MD;  Location: [facility] ENDOSCOPY;  Service: Cardiovascular;  Laterality: N/A;  . TOTAL HIP ARTHROPLASTY Right 2012        Home Medications    Prior to Admission medications   Medication Sig Start Date End Date Taking? Authorizing Provider  amoxicillin (AMOXIL) 500 MG capsule Take 2,000 mg by mouth See admin instructions. Take 2,000 mg by mouth one hour prior to dental procedures 09/25/13  Yes [provider]  aspirin EC 81 MG tablet Take 81 mg by mouth daily.   Yes  [provider]  atorvastatin (LIPITOR) 40 MG tablet Take 1 tablet (40 mg total) by mouth daily. 11/24/15  Yes Jake Bathe, MD  Cyanocobalamin (B-12 PO) Take 1 tablet by mouth daily.   Yes [provider]  furosemide (LASIX) 20 MG tablet TAKE 1 TABLET BY MOUTH EVERY DAY Patient taking differently: Take 20 mg by mouth daily.  09/09/18  Yes Jake Bathe, MD  ibuprofen  (ADVIL,MOTRIN) 200 MG tablet Take 400 mg by mouth daily as needed for headache or moderate pain.   Yes [provider]  lisinopril (PRINIVIL,ZESTRIL) 5 MG tablet TAKE 1 TABLET BY MOUTH EVERY DAY 09/10/18  Yes Jake Bathe, MD  metoprolol succinate (TOPROL-XL) 25 MG 24 hr tablet TAKE 1 TABLET BY MOUTH EVERY DAY Patient taking differently: Take 25 mg by mouth daily.  05/22/18  Yes Jake Bathe, MD  PRESCRIPTION MEDICATION CPAP: At bedtime only   Yes [provider]    Family History Family History  Problem Relation Age of Onset  . Diabetes Father   . Cancer Father   . Stroke Father   . Diabetes Brother   . Hypertension Mother   . Heart attack Unknown     Social History Social History   Tobacco Use  . Smoking status: Former Smoker    Last attempt to quit: 12/25/1994    Years since quitting: 23.7  . Smokeless tobacco: Never Used  Substance Use Topics  . Alcohol use: Yes    Alcohol/week: 6.0 standard drinks    Types: 6 Cans of beer per week  . Drug use: No     Allergies   Patient has no known allergies.   Review of Systems Review of Systems  Constitutional: Negative for chills, diaphoresis and fever.  Respiratory: Positive for shortness of breath. Negative for cough.   Cardiovascular: Positive for chest pain. Negative for palpitations and leg swelling.  Gastrointestinal: Positive for nausea. Negative for abdominal pain, diarrhea and vomiting.  Musculoskeletal: Negative for back pain.  Neurological: Positive for light-headedness. Negative for syncope and weakness.  All other systems reviewed and are negative.    Physical Exam Updated Vital Signs BP 126/82 (BP Location: Right Arm)   Pulse 64   Temp 98 F (36.7 C) (Oral)   Resp 18   SpO2 98%   Physical Exam  Constitutional: He appears well-developed and well-nourished. No distress.  HENT:  Head: Normocephalic and atraumatic.  Eyes: Conjunctivae are normal.  Neck: Neck supple.  Cardiovascular:  Normal rate, regular rhythm, normal heart sounds and intact distal pulses.  Pulmonary/Chest: Effort normal and breath sounds normal. No respiratory distress.  Abdominal: Soft. There is no tenderness. There is no guarding.  Musculoskeletal: He exhibits no edema.  Lymphadenopathy:    He has no cervical adenopathy.  Neurological: He is alert.  Skin: Skin is warm and dry. He is not diaphoretic.  Psychiatric: He has a normal mood and affect. His behavior is normal.  Nursing note and vitals reviewed.    ED Treatments / Results  Labs (all labs ordered are listed, but only abnormal results are displayed) Labs Reviewed  BASIC METABOLIC PANEL - Abnormal; Notable for the following components:      Result Value   Glucose, Bld 131 (*)    All other components within normal limits  CBC  APTT  PROTIME-INR  TROPONIN I  TROPONIN I  LIPASE, BLOOD  HEPATIC FUNCTION PANEL  I-STAT TROPONIN, ED    EKG EKG Interpretation  Date/Time:  Monday September 16 2018 12:21:59 EDT Ventricular Rate:  57 PR Interval:  192 QRS Duration: 112 QT Interval:  408 QTC Calculation: 397 R Axis:   15 Text Interpretation:  Sinus bradycardia Nonspecific ST and T wave abnormality Abnormal ECG Confirmed by Virgina Norfolk [phone]) on 09/16/2018 3:33:45 PM   Radiology Dg Chest 2 View  Result Date: 09/16/2018 CLINICAL DATA:  Chest pain EXAM: CHEST - 2 VIEW COMPARISON:  02/21/2011 FINDINGS: Cardiac shadows within normal limits. Mild scarring is again seen in the left base. No focal infiltrate or sizable effusion is noted. No other focal abnormality is seen. IMPRESSION: No active cardiopulmonary disease. Electronically Signed   By: Alcide Clever M.D.   On: 09/16/2018 13:23    Procedures .Critical Care Performed by: Anselm Pancoast, PA-C Authorized by: Anselm Pancoast, PA-C   Critical care provider statement:    Critical care time (minutes):  35   Critical care time was exclusive of:  Separately billable procedures and  treating other patients   Critical care was necessary to treat or prevent imminent or life-threatening deterioration of the following conditions: Unstable angina.   Critical care was time spent personally by me on the following activities:  Development of treatment plan with patient or surrogate, discussions with consultants, examination of patient, obtaining history from patient or surrogate, ordering and performing treatments and interventions, ordering and review of laboratory studies, ordering and review of radiographic studies, pulse oximetry, re-evaluation of patient's condition and review of old charts   I assumed direction of critical care for this patient from another provider in my specialty: no     (including critical care time)  Medications Ordered in ED Medications  heparin bolus via infusion 4,000 Units (has no administration in time range)  heparin ADULT infusion 100 units/mL (25000 units/246mL sodium chloride 0.45%) (has no administration in time range)  aspirin chewable tablet 324 mg (324 mg Oral Given 09/16/18 1722)     Initial Impression / Assessment and Plan / ED Course  I have reviewed the triage vital signs and the nursing notes.  Pertinent labs & imaging results that were available during my care of the patient were reviewed by me and considered in my medical decision making (see chart for details).  Clinical Course as of Sep 16 1722  Mon Sep 16, 2018  1633 Spoke with Dr. Anne Fu, cardiologist. States he will come evaluate the patient.   [SJ]    Clinical Course User Index [SJ] Yoshika Vensel C, PA-C    Patient presents with intermittent chest pain with features concerning for unstable angina.  Pain-free during my interview.  Admission via cardiology.  Findings and plan of care discussed with Virgina Norfolk, DO. Dr. Lockie Mola personally evaluated and examined this patient.   Vitals:   09/16/18 1221 09/16/18 1425 09/16/18 1600 09/16/18 1700  BP: (!) 151/95 126/82 (!)  113/99 114/76  Pulse: (!) 57 64 (!) 56 (!) 54  Resp: 20 18 18  (!) 25  Temp: 98 F (36.7 C)     TempSrc: Oral     SpO2: 100% 98% 99% 97%  Weight:   133.4 kg   Height:   5\' 9"  (1.753 m)       Final Clinical Impressions(s) / ED Diagnoses   Final diagnoses:  Unstable angina [facility])    ED Discharge Orders    None       Concepcion Living 09/16/18 1725    Virgina Norfolk, DO 09/16/18 1754

## 2018-09-16 NOTE — ED Triage Notes (Signed)
Pt in c/o palpitations, fatigue and weakness that is worsened in the last day, has appointment with cardiologist tomorrow but symptoms worsened during the night so told to come in, no distress noted, denies chest pain, report shortness of breath and nausea at times

## 2018-09-17 ENCOUNTER — Encounter (HOSPITAL_COMMUNITY)
Admission: EM | Disposition: A | Discharge status: Home / Self Care | Payer: Self-pay | Source: Home / Self Care | Attending: Emergency Medicine

## 2018-09-17 ENCOUNTER — Encounter (HOSPITAL_COMMUNITY): Payer: Self-pay | Admitting: Interventional Cardiology

## 2018-09-17 ENCOUNTER — Ambulatory Visit: Payer: PPO | Admitting: Physician Assistant

## 2018-09-17 ENCOUNTER — Other Ambulatory Visit (HOSPITAL_COMMUNITY): Payer: PPO

## 2018-09-17 ENCOUNTER — Observation Stay (HOSPITAL_COMMUNITY): Payer: PPO

## 2018-09-17 DIAGNOSIS — Z87891 Personal history of nicotine dependence: Secondary | ICD-10-CM | POA: Diagnosis not present

## 2018-09-17 DIAGNOSIS — R109 Unspecified abdominal pain: Secondary | ICD-10-CM | POA: Diagnosis not present

## 2018-09-17 DIAGNOSIS — R079 Chest pain, unspecified: Secondary | ICD-10-CM

## 2018-09-17 DIAGNOSIS — I200 Unstable angina: Secondary | ICD-10-CM | POA: Diagnosis not present

## 2018-09-17 DIAGNOSIS — I4891 Unspecified atrial fibrillation: Secondary | ICD-10-CM

## 2018-09-17 DIAGNOSIS — I25110 Atherosclerotic heart disease of native coronary artery with unstable angina pectoris: Secondary | ICD-10-CM

## 2018-09-17 DIAGNOSIS — I480 Paroxysmal atrial fibrillation: Secondary | ICD-10-CM

## 2018-09-17 DIAGNOSIS — I5022 Chronic systolic (congestive) heart failure: Secondary | ICD-10-CM | POA: Diagnosis not present

## 2018-09-17 DIAGNOSIS — Z6841 Body Mass Index (BMI) 40.0 and over, adult: Secondary | ICD-10-CM | POA: Diagnosis not present

## 2018-09-17 DIAGNOSIS — E785 Hyperlipidemia, unspecified: Secondary | ICD-10-CM | POA: Diagnosis not present

## 2018-09-17 DIAGNOSIS — G4733 Obstructive sleep apnea (adult) (pediatric): Secondary | ICD-10-CM | POA: Diagnosis not present

## 2018-09-17 DIAGNOSIS — I429 Cardiomyopathy, unspecified: Secondary | ICD-10-CM | POA: Diagnosis not present

## 2018-09-17 DIAGNOSIS — K769 Liver disease, unspecified: Secondary | ICD-10-CM | POA: Diagnosis not present

## 2018-09-17 DIAGNOSIS — M1990 Unspecified osteoarthritis, unspecified site: Secondary | ICD-10-CM | POA: Diagnosis not present

## 2018-09-17 DIAGNOSIS — N529 Male erectile dysfunction, unspecified: Secondary | ICD-10-CM | POA: Diagnosis not present

## 2018-09-17 HISTORY — PX: ULTRASOUND GUIDANCE FOR VASCULAR ACCESS: SHX6516

## 2018-09-17 HISTORY — PX: RIGHT/LEFT HEART CATH AND CORONARY ANGIOGRAPHY: CATH118266

## 2018-09-17 LAB — HIV ANTIBODY (ROUTINE TESTING W REFLEX): HIV Screen 4th Generation wRfx: NONREACTIVE

## 2018-09-17 LAB — HEPATIC FUNCTION PANEL
ALT: 26 U/L (ref 0–44)
AST: 18 U/L (ref 15–41)
Albumin: 3.4 g/dL — ABNORMAL LOW (ref 3.5–5.0)
Alkaline Phosphatase: 60 U/L (ref 38–126)
Bilirubin, Direct: 0.1 mg/dL (ref 0.0–0.2)
Indirect Bilirubin: 0.6 mg/dL (ref 0.3–0.9)
Total Bilirubin: 0.7 mg/dL (ref 0.3–1.2)
Total Protein: 6.7 g/dL (ref 6.5–8.1)

## 2018-09-17 LAB — POCT I-STAT 3, VENOUS BLOOD GAS (G3P V)
Acid-base deficit: 2 mmol/L (ref 0.0–2.0)
Acid-base deficit: 3 mmol/L — ABNORMAL HIGH (ref 0.0–2.0)
Bicarbonate: 23.5 mmol/L (ref 20.0–28.0)
Bicarbonate: 24.3 mmol/L (ref 20.0–28.0)
O2 Saturation: 70 %
O2 Saturation: 96 %
TCO2: 25 mmol/L (ref 22–32)
TCO2: 26 mmol/L (ref 22–32)
pCO2, Ven: 44.7 mmHg (ref 44.0–60.0)
pCO2, Ven: 46.7 mmHg (ref 44.0–60.0)
pH, Ven: 7.324 (ref 7.250–7.430)
pH, Ven: 7.329 (ref 7.250–7.430)
pO2, Ven: 40 mmHg (ref 32.0–45.0)
pO2, Ven: 90 mmHg — ABNORMAL HIGH (ref 32.0–45.0)

## 2018-09-17 LAB — TROPONIN I
Troponin I: <0.03 ng/mL (ref <0.03)
Troponin I: <0.03 ng/mL (ref <0.03)

## 2018-09-17 LAB — LIPID PANEL
Cholesterol: 165 mg/dL (ref 0–200)
HDL: 41 mg/dL (ref >40)
LDL Cholesterol: 96 mg/dL (ref 0–99)
Total CHOL/HDL Ratio: 4 RATIO
Triglycerides: 139 mg/dL (ref <150)
VLDL: 28 mg/dL (ref 0–40)

## 2018-09-17 LAB — BASIC METABOLIC PANEL
Anion gap: 11 (ref 5–15)
BUN: 18 mg/dL (ref 8–23)
CO2: 25 mmol/L (ref 22–32)
Calcium: 8.6 mg/dL — ABNORMAL LOW (ref 8.9–10.3)
Chloride: 105 mmol/L (ref 98–111)
Creatinine, Ser: 0.9 mg/dL (ref 0.61–1.24)
GFR calc Af Amer: >60 mL/min (ref >60)
GFR calc non Af Amer: >60 mL/min (ref >60)
Glucose, Bld: 107 mg/dL — ABNORMAL HIGH (ref 70–99)
Potassium: 4.4 mmol/L (ref 3.5–5.1)
Sodium: 141 mmol/L (ref 135–145)

## 2018-09-17 LAB — CBC
HCT: 44.6 % (ref 39.0–52.0)
Hemoglobin: 14.3 g/dL (ref 13.0–17.0)
MCH: 31.1 pg (ref 26.0–34.0)
MCHC: 32.1 g/dL (ref 30.0–36.0)
MCV: 97 fL (ref 78.0–100.0)
Platelets: 182 10*3/uL (ref 150–400)
RBC: 4.6 MIL/uL (ref 4.22–5.81)
RDW: 13.2 % (ref 11.5–15.5)
WBC: 6.5 10*3/uL (ref 4.0–10.5)

## 2018-09-17 LAB — PROTIME-INR
INR: 0.99
Prothrombin Time: 13 seconds (ref 11.4–15.2)

## 2018-09-17 LAB — HEPARIN LEVEL (UNFRACTIONATED)
Heparin Unfractionated: 0.29 IU/mL — ABNORMAL LOW (ref 0.30–0.70)
Heparin Unfractionated: 0.38 IU/mL (ref 0.30–0.70)

## 2018-09-17 SURGERY — RIGHT/LEFT HEART CATH AND CORONARY ANGIOGRAPHY
Anesthesia: LOCAL

## 2018-09-17 MED ORDER — IOHEXOL 350 MG/ML SOLN
INTRAVENOUS | Status: DC | PRN
  Administered 2018-09-17: 45 mL via INTRA_ARTERIAL

## 2018-09-17 MED ORDER — MIDAZOLAM HCL 2 MG/2ML IJ SOLN
INTRAMUSCULAR | Status: AC
  Filled 2018-09-17: qty 2

## 2018-09-17 MED ORDER — VERAPAMIL HCL 2.5 MG/ML IV SOLN
INTRAVENOUS | Status: DC | PRN
  Administered 2018-09-17: 10 mL via INTRA_ARTERIAL

## 2018-09-17 MED ORDER — VERAPAMIL HCL 2.5 MG/ML IV SOLN
INTRAVENOUS | Status: AC
  Filled 2018-09-17: qty 2

## 2018-09-17 MED ORDER — APIXABAN 5 MG PO TABS: 5.0000 mg | ORAL_TABLET | Freq: Two times a day (BID) | ORAL | Status: DC

## 2018-09-17 MED ORDER — FENTANYL CITRATE (PF) 100 MCG/2ML IJ SOLN
INTRAMUSCULAR | Status: DC | PRN
  Administered 2018-09-17: 25 ug via INTRAVENOUS

## 2018-09-17 MED ORDER — SODIUM CHLORIDE 0.9 % IV SOLN: 250.0000 mL | INTRAVENOUS | Status: DC | PRN

## 2018-09-17 MED ORDER — SODIUM CHLORIDE 0.9 % IV SOLN
INTRAVENOUS | Status: AC
  Administered 2018-09-17: 10:00:00 via INTRAVENOUS

## 2018-09-17 MED ORDER — ACETAMINOPHEN 325 MG PO TABS: 650.0000 mg | ORAL_TABLET | ORAL | Status: DC | PRN

## 2018-09-17 MED ORDER — LIDOCAINE HCL (PF) 1 % IJ SOLN
INTRAMUSCULAR | Status: DC | PRN
  Administered 2018-09-17: 1 mL
  Administered 2018-09-17: 4 mL

## 2018-09-17 MED ORDER — HEPARIN (PORCINE) IN NACL 1000-0.9 UT/500ML-% IV SOLN
INTRAVENOUS | Status: AC
  Filled 2018-09-17: qty 500

## 2018-09-17 MED ORDER — HEPARIN (PORCINE) IN NACL 1000-0.9 UT/500ML-% IV SOLN
INTRAVENOUS | Status: DC | PRN
  Administered 2018-09-17: 500 mL

## 2018-09-17 MED ORDER — ONDANSETRON HCL 4 MG/2ML IJ SOLN: 4.0000 mg | Freq: Four times a day (QID) | INTRAMUSCULAR | Status: DC | PRN

## 2018-09-17 MED ORDER — FENTANYL CITRATE (PF) 100 MCG/2ML IJ SOLN
INTRAMUSCULAR | Status: AC
  Filled 2018-09-17: qty 2

## 2018-09-17 MED ORDER — SODIUM CHLORIDE 0.9% FLUSH: 3.0000 mL | Freq: Two times a day (BID) | INTRAVENOUS | Status: DC

## 2018-09-17 MED ORDER — HEPARIN SODIUM (PORCINE) 1000 UNIT/ML IJ SOLN
INTRAMUSCULAR | Status: DC | PRN
  Administered 2018-09-17: 6000 [IU] via INTRAVENOUS

## 2018-09-17 MED ORDER — HEPARIN SODIUM (PORCINE) 1000 UNIT/ML IJ SOLN
INTRAMUSCULAR | Status: AC
  Filled 2018-09-17: qty 1

## 2018-09-17 MED ORDER — MIDAZOLAM HCL 2 MG/2ML IJ SOLN
INTRAMUSCULAR | Status: DC | PRN
  Administered 2018-09-17: 1 mg via INTRAVENOUS
  Administered 2018-09-17: 2 mg via INTRAVENOUS

## 2018-09-17 MED ORDER — SODIUM CHLORIDE 0.9% FLUSH: 3.0000 mL | INTRAVENOUS | Status: DC | PRN

## 2018-09-17 MED ORDER — LIDOCAINE HCL (PF) 1 % IJ SOLN
INTRAMUSCULAR | Status: AC
  Filled 2018-09-17: qty 30

## 2018-09-17 MED ORDER — APIXABAN 5 MG PO TABS: 5.0000 mg | ORAL_TABLET | Freq: Two times a day (BID) | ORAL | 3 refills | Status: DC

## 2018-09-17 MED FILL — ELIQUIS 5 MG TABLET: 5 | 30 days supply | Qty: 60 | Fill #0

## 2018-09-17 SURGICAL SUPPLY — 15 items
CATH 5FR JL3.5 JR4 ANG PIG MP (CATHETERS) ×3 IMPLANT
CATH BALLN WEDGE 5F 110CM (CATHETERS) ×3 IMPLANT
DEVICE RAD COMP TR BAND LRG (VASCULAR PRODUCTS) ×3 IMPLANT
GLIDESHEATH SLEND SS 6F .021 (SHEATH) ×3 IMPLANT
GUIDEWIRE .025 260CM (WIRE) ×3 IMPLANT
GUIDEWIRE INQWIRE 1.5J.035X260 (WIRE) ×2 IMPLANT
INQWIRE 1.5J .035X260CM (WIRE) ×3
KIT HEART LEFT (KITS) ×3 IMPLANT
PACK CARDIAC CATHETERIZATION (CUSTOM PROCEDURE TRAY) ×3 IMPLANT
SHEATH GLIDE SLENDER 4/5FR (SHEATH) ×3 IMPLANT
SHEATH PROBE COVER 6X72 (BAG) ×3 IMPLANT
TRANSDUCER W/STOPCOCK (MISCELLANEOUS) ×3 IMPLANT
TUBING CIL FLEX 10 FLL-RA (TUBING) ×3 IMPLANT
WIRE ASAHI PROWATER 180CM (WIRE) ×3 IMPLANT
WIRE HI TORQ VERSACORE-J 145CM (WIRE) ×3 IMPLANT

## 2018-09-17 NOTE — Progress Notes (Signed)
ANTICOAGULATION CONSULT NOTE - Follow Up Consult  Pharmacy Consult for heparin Indication: chest pain/ACS  Labs: Recent Labs    09/16/18 1232 09/16/18 1620 09/16/18 1827 09/16/18 2333  HGB 14.9  --   --   --   HCT 46.9  --   --   --   PLT 201  --   --   --   APTT  --  26  --   --   LABPROT  --  12.5  --   --   INR  --  0.94  --   --   HEPARINUNFRC  --   --   --  0.29*  CREATININE 0.91  --   --   --   TROPONINI  --  <0.03 <0.03  --     Assessment: 67yo male slightly subtherapeutic on heparin with initial dosing for CP.  Goal of Therapy:  Heparin level 0.3-0.7 units/ml   Plan:  Will increase heparin gtt by 1 units/kgABW/hr to 1400 units/hr and check level with am labs.    Vernard Gambles, PharmD, BCPS  09/17/2018,12:33 AM

## 2018-09-17 NOTE — Progress Notes (Signed)
ANTICOAGULATION CONSULT NOTE  Pharmacy Consult for apixaban Indication: afib  No Known Allergies  Patient Measurements: Height: 5\' 8"  (172.7 cm) Weight: 293 lb 12.8 oz (133.3 kg) IBW/kg (Calculated) : 68.4 Heparin Dosing Weight: 102  Vital Signs: Temp: 98.2 F (36.8 C) (09/24 1008) Temp Source: Oral (09/24 1008) BP: 111/77 (09/24 1126) Pulse Rate: 95 (09/24 1126)  Labs: Recent Labs    09/16/18 1232  09/16/18 1620 09/16/18 1827 09/16/18 2333 09/17/18 0550  HGB 14.9  --   --   --   --  14.3  HCT 46.9  --   --   --   --  44.6  PLT 201  --   --   --   --  182  APTT  --   --  26  --   --   --   LABPROT  --   --  12.5  --   --  13.0  INR  --   --  0.94  --   --  0.99  HEPARINUNFRC  --   --   --   --  0.29* 0.38  CREATININE 0.91  --   --   --   --  0.90  TROPONINI  --    < > <0.03 <0.03 <0.03 <0.03   < > = values in this interval not displayed.    Estimated Creatinine Clearance: 106.3 mL/min (by C-G formula based on SCr of 0.9 mg/dL).    Assessment: 67 yo M admitted with chest pain. He is s/p cath with no CAD but noted with afib to start apixaban. -hg= 14.3, plt= 182  Goal of Therapy:  Heparin level 0.3-0.7 units/ml Monitor platelets by anticoagulation protocol: Yes   Plan:  -With recent cath, begin apixaban tonight -Will check on providing patient a supply from [facility]  Harland German, PharmD Clinical Pharmacist Please check Amion for pharmacy contact number

## 2018-09-17 NOTE — Care Management (Signed)
[facility] provided patient with Eliquis. No further home needs identified at this time. Gala Lewandowsky, RN,BSN Case Manager [phone]

## 2018-09-17 NOTE — Discharge Summary (Addendum)
Discharge Summary    Patient ID: Jerome Kent,  MRN: [id number], DOB/AGE: Dec 07, 1951 67 y.o.  Admit date: 09/16/2018 Discharge date: 09/17/2018  Primary Care Provider: Merri Brunette Primary Cardiologist: [name], MD  Discharge Diagnoses    Principal Problem:   Unstable angina [facility]) Active Problems:   Cardiomyopathy (HCC)   Obesity   OSA (obstructive sleep apnea)   Atrial fibrillation (HCC)   Hyperlipidemia   Allergies No Known Allergies  Diagnostic Studies/Procedures    Right/Left heart catheterization 09/17/18:  LV end diastolic pressure is normal. LVEDP 13 mm Hg.  There is no aortic valve stenosis.  No angiographically apparent coronary artery disease.  Normal right heart pressures. PA pressure 27/16, mean PA pressure 20 mm Hg; PCWP 19/15, mean 14 mm Hg; Ao sat 96%, PA sat 70%; CO 6.3 L/min; CI 2.6  The left ventricular ejection fraction is 55-65% by visual estimate.  The left ventricular systolic function is normal.  Right radial loop noted. Patient did tolerate straightening of the loop. Consider alternate access site if cath needed in the future.    Continue preventive therapy and risk factor modification.    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 Elberta Fortis.  He is also treated for sleep apnea with BiPAP/CPAP.  At last office visit he was doing quite well, working on weight loss but pleased with his improvement in ejection fraction up to 60 to 65% on echocardiogram 06/15/2017.  He presented to the ED on 09/16/18 after developing severe mid epigastric chest discomfort that occurred when he was laying down going to bed on 09/15/18.  No unusual foods.  This pain was quite severe, he got out of bed, felt some nausea diaphoresis.   After about 2 hours or so it went away.  He did take an aspirin to help and see if it relieved it.  The following day, 09/16/18, he had another episode not associated with food or exertion that was concerning to him.  At this time, he and his wife decided to come to the emergency room for further evaluation.  He has been feeling some increasing shortness of breath with activity, mild orthopnea.   He thought at one point he may have been out of rhythm but was in normal sinus rhythm in the emergency department.  Seems to be quite anxious about this.  When asked, he still has his gallbladder in place.  He denies any recent fevers chills syncope bleeding.  No melena.  No acid reflux sensation.  He has had some mild dizziness.   Hospital Course     Consultants: None   1. Unstable angina: patient presented with complaints of chest pain c/f unstable angina. Troponins were negative and EKG non-ischemic. He was recommended for [facility] for definitive evaluation given prior cardiomyopathy which occurred on 09/17/18 and revealed normal coronary arteries and EF 55-65%. His TR band was removed without complications and right radial cath site was without hematoma or ecchymosis. After his catheterization he went into atrial fibrillation, which may have contributed to his presenting symptoms if he was experiencing paroxysmal atrial fibrillation prior to arrival. Given epigastric discomfort, RUQ US performed to evaluate gallbladder which showed no acute findings; LFTs wnl. He was recommended to continue risk factor modifications. - In light  of recurrent atrial fibrillation and need for anticoagulation, will stop aspirin at this time to minimize bleeding risk.  - Continue to encourage healthy lifestyle and dietary modifications   2. Atrial fibrillation: seen on telemetry following cardiac catheterization and confirmed on EKG. Prior history of atrial flutter s/p ablation 01/2017. He has been on xarelto in the past but  reported significant hematuria with negative work-up with urology which resolved with discontinuation of xarelto. His CHA2DS2-VASc Score is 2 (CHF and age 75-75). Decision was made to start apixaban 5mg  BID for stroke ppx.  - Continue apixaban 5mg  BID - patient instructed to start medication in AM 09/18/18 - Follow-up scheduled with Dr. Elberta Fortis for further recommendations on management of atrial fibrillation. Could consider DCCV vs repeat ablation in 3-4 weeks.   3. History of non-ischemic cardiomyopathy: EF 55-65% on R/LHC this admission.  - Continue metoprolol, lisinopril, and lasix   4. HLD: LDL 96 this admission - Continue statin for risk management  5. OSA: likely contributing to #2. On CPAP - Continue CPAP  6. Morbid obesity:  - Continue to encourage healthy lifestyle and dietary modifications   7. Abnormal RUQ [place]: suggestive of fatty liver disease.  - Instructed patient to follow-up with PCP for further monitoring.  _____________  Discharge Vitals Blood pressure 111/83, pulse 83, temperature 98.2 F (36.8 C), temperature source Oral, resp. rate 16, height 5\' 8"  (1.727 m), weight 133.3 kg, SpO2 95 %.  Filed Weights   09/16/18 1600 09/16/18 2012 09/17/18 0604  Weight: 133.4 kg 132.4 kg 133.3 kg    Labs & Radiologic Studies    CBC Recent Labs    09/16/18 1232 09/17/18 0550  WBC 6.7 6.5  HGB 14.9 14.3  HCT 46.9 44.6  MCV 95.7 97.0  PLT 201 182   Basic Metabolic Panel Recent Labs    97/67/34 1232 09/17/18 0550  NA 139 141  K 4.1 4.4  CL 101 105  CO2 28 25  GLUCOSE 131* 107*  BUN 16 18  CREATININE 0.91 0.90  CALCIUM 9.0 8.6*   Liver Function Tests Recent Labs    09/16/18 1827 09/16/18 2333  AST 21 18  ALT 27 26  ALKPHOS 69 60  BILITOT 0.6 0.7  PROT 7.0 6.7  ALBUMIN 3.7 3.4*   Recent Labs    09/16/18 1827  LIPASE 37   Cardiac Enzymes Recent Labs    09/16/18 1827 09/16/18 2333 09/17/18 0550  TROPONINI <0.03 <0.03 <0.03   BNP Invalid  input(s): POCBNP D-Dimer No results for input(s): DDIMER in the last 72 hours. Hemoglobin A1C Recent Labs    09/16/18 1827  HGBA1C 5.8*   Fasting Lipid Panel Recent Labs    09/17/18 0550  CHOL 165  HDL 41  LDLCALC 96  TRIG 139  CHOLHDL 4.0   Thyroid Function Tests Recent Labs    09/16/18 1827  TSH 2.468   _____________  Dg Chest 2 View  Result Date: 09/16/2018 CLINICAL DATA:  Chest pain EXAM: CHEST - 2 VIEW COMPARISON:  02/21/2011 FINDINGS: Cardiac shadows within normal limits. Mild scarring is again seen in the left base. No focal infiltrate or sizable effusion is noted. No other focal abnormality is seen. IMPRESSION: No active cardiopulmonary disease. Electronically Signed   By: Alcide Clever M.D.   On: 09/16/2018 13:23   US Abdomen Limited Ruq  Result Date: 09/17/2018 CLINICAL DATA:  67 year old male with abdominal pain. Initial encounter. EXAM: ULTRASOUND ABDOMEN LIMITED RIGHT UPPER QUADRANT COMPARISON:  None. FINDINGS: Gallbladder: No  gallstones or wall thickening visualized. No sonographic Murphy sign noted by sonographer. Common bile duct: Diameter: 4.1 mm Liver: Liver of increased echogenicity consistent with fatty infiltration and/or hepatocellular disease. No focal hepatic lesion noted. Portal vein is patent on color Doppler imaging with normal direction of blood flow towards the liver. IMPRESSION: 1. Liver of increased echogenicity consistent with fatty infiltration and/or hepatocellular disease. No focal hepatic lesion noted. 2. Gallbladder unremarkable. Electronically Signed   By: Lacy Duverney M.D.   On: 09/17/2018 15:02   Disposition   Patient was seen and examined by Dr. Anne Fu who deemed patient as stable for discharge. Follow-up has been arranged. Discharge medications as listed below.   Follow-up Plans & Appointments    Follow-up Information    Dyann Kief, PA-C Follow up on 10/01/2018.   Specialty:  Cardiology Why:  Please arrive 15 minutes early for  your 12pm post-hospital cardiology follow-up appointment Contact information: [address] [place] [address] [phone]        Regan Lemming, MD Follow up on 10/08/2018.   Specialty:  Cardiology Why:  Please arrive 15 minutes early for your 12:15pm appointment  Contact information: [address] [place] [address] [phone]            Discharge Medications   Allergies as of 09/17/2018   No Known Allergies     Medication List    STOP taking these medications   aspirin EC 81 MG tablet   ibuprofen 200 MG tablet Commonly known as:  ADVIL,MOTRIN     TAKE these medications   amoxicillin 500 MG capsule Commonly known as:  AMOXIL Take 2,000 mg by mouth See admin instructions. Take 2,000 mg by mouth one hour prior to dental procedures   apixaban 5 MG Tabs tablet Commonly known as:  ELIQUIS Take 1 tablet (5 mg total) by mouth 2 (two) times daily. Start taking on:  09/18/2018   atorvastatin 40 MG tablet Commonly known as:  LIPITOR Take 1 tablet (40 mg total) by mouth daily.   B-12 PO Take 1 tablet by mouth daily.   furosemide 20 MG tablet Commonly known as:  LASIX TAKE 1 TABLET BY MOUTH EVERY DAY   lisinopril 5 MG tablet Commonly known as:  PRINIVIL,ZESTRIL TAKE 1 TABLET BY MOUTH EVERY DAY   metoprolol succinate 25 MG 24 hr tablet Commonly known as:  TOPROL-XL TAKE 1 TABLET BY MOUTH EVERY DAY   PRESCRIPTION MEDICATION CPAP: At bedtime only        Outstanding Labs/Studies   None  Duration of Discharge Encounter   Greater than 30 minutes including physician time.  Signed, Beatriz Stallion PA-C 09/17/2018, 3:24 PM   Personally seen and examined. Agree with above.  Overall reassuring cardiac catheterization.  Post procedure, developed episode of atrial fibrillation with rate control.  Given his overall bradycardia at rest, low-dose metoprolol was continued.  EF reassuring.  Right upper quadrant  ultrasound demonstrated fatty liver disease.  No obvious gallbladder disease.  Exam: Alert and oriented x3, regular rate and rhythm no murmurs rubs or gallops, lungs are clear, abdomen obese, soft.  Alert.  Chest pain - Original story concerning for unstable angina however his coronary arteries appear normal.  Fantastic.  Continue with noncardiac work-up per primary physician.  Gallbladder ultrasound reassuring other than fatty liver disease. Lipase was normal, LFTs were normal  Paroxysmal atrial fibrillation with recent atrial flutter ablation - This was seen post catheterization.  Eliquis  5 mg twice a day starting back after 1 day post cath.  Currently rate controlled.  We will set up a follow-up appointment with Dr. Elberta Fortis.  If he remains in atrial fibrillation, could consider cardioversion in 3 weeks.  Another approach could be to consider atrial fibrillation ablation.  Prior nonischemic cardiomyopathy - Lowest EF seen was 15%, currently normal.  Excellent.  We will see him back in clinic as previously scheduled.  [name], MD

## 2018-09-17 NOTE — Progress Notes (Signed)
    Cardiac catheterization reassuring with no CAD.  Normal EF on hand-injection.  Right radial loop noted.  I will order a right upper quadrant ultrasound to evaluate his gallbladder given his midepigastric discomfort.  Lipase, LFTs were normal.  If ultrasound is unremarkable, he may be discharged.  Overall reassurance from cardiac perspective.  [name], MD

## 2018-09-17 NOTE — Progress Notes (Addendum)
R. Radial and R. brachial  Site level 0. Pt dose have some bruising near the R. Brachial site but pt says he had the bruise prior to cath and hospital stay. Will cont to monitor pt

## 2018-09-17 NOTE — Progress Notes (Signed)
   Notified by RN that patient went into atrial fibrillation following cardiac catheterization this morning. Patient denies feeling palpitations. He reported with atrial flutter in the past, he had increased fatigue and intermittent SOB prompting him to seek evaluation. He has been feeling some of these symptoms recently as well, suspicious for paroxysmal atrial fibrillation. In the past he had been on xarelto but reported significant hematuria, seen by urology outpatient with negative work-up. He reported resolution of hematuria with discontinuation of xarelto.   EKG confirmed atrial fibrillation with rate 91. On telemetry rate has been generally well controlled in the 80s-100s. Baseline rhythm is sinus bradycardia.   A/P: recurrent atrial fibrillation with good rate control - Will start apixaban 5mg  BID for CHA2DS2-VASc Score of 2 (CHF and age 48-75). - Continue current metoprolol XL 25mg  daily for rate control - will not uptitrate given baseline sinus bradycardia in the event that this is paroxysmal atrial fibrillation.  - Will schedule a follow-up appointment with Dr. Elberta Fortis for further management of atrial fibrillation. Could consider DCCV vs repeat ablation after 3-4 weeks of anticoagulation.    Dr. Anne Fu aware and in agreement with plan. Anticipate discharge home later today after RUQ [place] to r/o gallbladder pathology and TR band removal following LHC this morning.

## 2018-09-17 NOTE — Care Management Obs Status (Signed)
MEDICARE OBSERVATION STATUS NOTIFICATION   Patient Details  Name: Jerome Kent MRN: [id number] Date of Birth: 1951/04/09   Medicare Observation Status Notification Given:  Yes    Gala Lewandowsky, RN 09/17/2018, 3:24 PM

## 2018-09-17 NOTE — Progress Notes (Signed)
Discharge instructions reviewed with pt. Pt has no questions at this time. IV d/c. R radial level 0.

## 2018-09-17 NOTE — Interval H&P Note (Signed)
Cath Lab Visit (complete for each Cath Lab visit)  Clinical Evaluation Leading to the Procedure:   ACS: Yes.    Non-ACS:    Anginal Classification: CCS IV  Anti-ischemic medical therapy: Minimal Therapy (1 class of medications)  Non-Invasive Test Results: No non-invasive testing performed  Prior CABG: No previous CABG      History and Physical Interval Note:  09/17/2018 7:32 AM  Jerome Kent  has presented today for surgery, with the diagnosis of cp  The various methods of treatment have been discussed with the patient and family. After consideration of risks, benefits and other options for treatment, the patient has consented to  Procedure(s): RIGHT/LEFT HEART CATH AND CORONARY ANGIOGRAPHY (N/A) as a surgical intervention .  The patient's history has been reviewed, patient examined, no change in status, stable for surgery.  I have reviewed the patient's chart and labs.  Questions were answered to the patient's satisfaction.     Lance Muss

## 2018-09-20 ENCOUNTER — Telehealth: Payer: Self-pay | Admitting: Cardiology

## 2018-09-20 NOTE — Telephone Encounter (Signed)
Called the patient.  He does endorse shortness of breath with little exertion.  He has been taking his heart rates and blood pressures.  Heart rates have been in the 60s to 70s with blood pressures in the 140s.  Would have him follow-up in clinic next week with Dr. Anne Fu or his APP.

## 2018-09-20 NOTE — Telephone Encounter (Signed)
Pt is very symptomatic with the afib ex just bending over to pick something up is SOB. Per pt feels okay sitting around the patient has had ablation in past .Will forward to Dr Elberta Fortis for review has f/u with Jacolyn Reedy PA on 10/8 and and 10/15  with Dr Elberta Fortis .Zack Seal

## 2018-09-20 NOTE — Telephone Encounter (Signed)
New Message    Pt c/o Shortness Of Breath: STAT if SOB developed within the last 24 hours or pt is noticeably SOB on the phone  1. Are you currently SOB (can you hear that pt is SOB on the phone)? No, could hear the patient in the back ground and he did not sound like he had sob  2. How long have you been experiencing SOB? On and off since 09/25  3. Are you SOB when sitting or when up moving around? Only when moving   4. Are you currently experiencing any other symptoms? Patients sgo stated that on Wednesday and it was mild.

## 2018-09-24 NOTE — Telephone Encounter (Signed)
Lm to call back ./cy 

## 2018-09-25 NOTE — Telephone Encounter (Signed)
lmtcb ./cy 

## 2018-09-25 NOTE — Telephone Encounter (Signed)
Pt has appt with Dr Anne Fu on 09-27-18 at 9:20 am .Zack Seal

## 2018-09-27 ENCOUNTER — Encounter: Payer: Self-pay | Admitting: Cardiology

## 2018-09-27 ENCOUNTER — Ambulatory Visit: Payer: PPO | Admitting: Cardiology

## 2018-09-27 VITALS — BP 130/80 | HR 48 | Ht 68.0 in | Wt 298.4 lb

## 2018-09-27 DIAGNOSIS — I5022 Chronic systolic (congestive) heart failure: Secondary | ICD-10-CM | POA: Diagnosis not present

## 2018-09-27 DIAGNOSIS — I4891 Unspecified atrial fibrillation: Secondary | ICD-10-CM

## 2018-09-27 DIAGNOSIS — Z9889 Other specified postprocedural states: Secondary | ICD-10-CM | POA: Diagnosis not present

## 2018-09-27 DIAGNOSIS — Z8679 Personal history of other diseases of the circulatory system: Secondary | ICD-10-CM

## 2018-09-27 NOTE — Patient Instructions (Addendum)
Your physician recommends that you continue on your current medications as directed. Please refer to the Current Medication list given to you today.  Your physician wants you to follow-up in: 6  MONTHS WITH DR Anne Fu You will receive a reminder letter in the mail two months in advance. If you don't receive a letter, please call our office to schedule the follow-up appointment.  KEEP APPOINTMENT WITH DR Elberta Fortis

## 2018-09-27 NOTE — Progress Notes (Signed)
Cardiology Office Note:    Date:  09/27/2018   ID:  Jerome Kent, DOB Apr 04, 1951, MRN [id number]  PCP:  Merri Brunette, MD  Cardiologist:  [name], MD  Electrophysiologist:  None   Referring MD: Merri Brunette, MD     History of Present Illness:    Jerome Kent is a 67 y.o. male with cardiomyopathy here for follow-up post hospital.  Back in 2014 he was diagnosed with nonischemic cardiomyopathy EF is 15% and catheterization at that time showed no CAD, developed atrial flutter, had ablation on 02/09/2017 with Dr. Elberta Fortis and is also treated with BiPAP/CPAP for sleep apnea.  His ejection fraction improved on 06/15/2017 to 60 to 65%.  He then presented to the emergency room on 09/16/2018 with severe chest discomfort mid epigastric discomfort when he was laying down in bed.  Felt nausea, diaphoresis.  Went away in about 2 hours.  The following day on 23 September he had another episode without any exertion no correlation with food.  They were worried so they decided to come to the emergency room for further evaluation.  He had been feeling some mild orthopnea and increasing shortness of breath.  He also thought that may be he was out of sinus rhythm and seems quite anxious.  He had never had a cholecystectomy.  Because of this, he underwent a right and left heart catheterization on 09/17/2018 which revealed normal coronary arteries thankfully and EF of 65%.  After his catheterization, he was noted to be in atrial fibrillation which interestingly may have contributed to his presenting symptoms.  A right upper quadrant ultrasound was also performed to make sure that he did not have any evidence of gallbladder disease.  This was normal.  LFTs were also normal.  Because of the atrial fibrillation (remember he did have a flutter ablation with Dr. Elberta Fortis previously) we stopped his aspirin and started Eliquis 5 mg twice a day.  His EKG on 09/27/2018 today in clinic shows marked sinus  bradycardia rate 48 with incomplete left bundle branch block, no other abnormalities.  He has auto converted out of atrial fibrillation.  Clearly paroxysmal atrial fibrillation.  No need for DC cardioversion obviously.  Felt like out of rhythm with SOB, loss of energy, dizzy. But past few days better. A lot better. Had chest pain prior. Did not feel like going to work on Monday, felt poor.  Discouraged.  Now he is feeling a lot better.    Past Medical History:  Diagnosis Date  . Acute systolic heart failure (HCC)   . Atrial flutter (HCC)   . Cardiomyopathy (HCC)   . Chronic systolic heart failure (HCC)    EF 15% on TEE 9/14 during cardioversion  . Dysrhythmia    atrial flutter  . Erectile dysfunction   . Hyperlipidemia   . Obesity   . OSA (obstructive sleep apnea)   . Osteoarthritis   . Shortness of breath   . Sleep apnea     Past Surgical History:  Procedure Laterality Date  . A-FLUTTER ABLATION N/A 02/09/2017   Procedure: A-Flutter Ablation;  Surgeon: Will Jorja Loa, MD;  Location: [facility] INVASIVE CV LAB;  Service: Cardiovascular;  Laterality: N/A;  . APPENDECTOMY    . CARDIOVERSION N/A 09/15/2013   Procedure: CARDIOVERSION;  Surgeon: [name], MD;  Location: [facility] ENDOSCOPY;  Service: Cardiovascular;  Laterality: N/A;  . CARDIOVERSION N/A 01/11/2017   Procedure: CARDIOVERSION;  Surgeon: Jake Bathe, MD;  Location: [facility] ENDOSCOPY;  Service: Cardiovascular;  Laterality: N/A;  .  LEFT HEART CATHETERIZATION WITH CORONARY ANGIOGRAM N/A 10/29/2013   Procedure: LEFT HEART CATHETERIZATION WITH CORONARY ANGIOGRAM;  Surgeon: [name], MD;  Location: [facility] CATH LAB;  Service: Cardiovascular;  Laterality: N/A;  . RIGHT/LEFT HEART CATH AND CORONARY ANGIOGRAPHY N/A 09/17/2018   Procedure: RIGHT/LEFT HEART CATH AND CORONARY ANGIOGRAPHY;  Surgeon: Corky Crafts, MD;  Location: [facility] INVASIVE CV LAB;  Service: Cardiovascular;  Laterality: N/A;  . TEE WITHOUT CARDIOVERSION N/A 09/15/2013    Procedure: TRANSESOPHAGEAL ECHOCARDIOGRAM (TEE);  Surgeon: [name], MD;  Location: [facility] ENDOSCOPY;  Service: Cardiovascular;  Laterality: N/A;  . TEE WITHOUT CARDIOVERSION N/A 01/11/2017   Procedure: TRANSESOPHAGEAL ECHOCARDIOGRAM (TEE);  Surgeon: Jake Bathe, MD;  Location: [facility] ENDOSCOPY;  Service: Cardiovascular;  Laterality: N/A;  . TOTAL HIP ARTHROPLASTY Right 2012  . ULTRASOUND GUIDANCE FOR VASCULAR ACCESS  09/17/2018   Procedure: Ultrasound Guidance For Vascular Access;  Surgeon: Corky Crafts, MD;  Location: [facility] INVASIVE CV LAB;  Service: Cardiovascular;;    Current Medications: Current Meds  Medication Sig  . amoxicillin (AMOXIL) 500 MG capsule Take 2,000 mg by mouth See admin instructions. Take 2,000 mg by mouth one hour prior to dental procedures  . apixaban (ELIQUIS) 5 MG TABS tablet Take 1 tablet (5 mg total) by mouth 2 (two) times daily.  [name] Kitchen atorvastatin (LIPITOR) 40 MG tablet Take 1 tablet (40 mg total) by mouth daily.  . Cyanocobalamin (B-12 PO) Take 1 tablet by mouth daily.  . furosemide (LASIX) 20 MG tablet TAKE 1 TABLET BY MOUTH EVERY DAY (Patient taking differently: TAKE 1 TABLET BY MOUTH EVERY DAY)  . lisinopril (PRINIVIL,ZESTRIL) 5 MG tablet TAKE 1 TABLET BY MOUTH EVERY DAY  . metoprolol succinate (TOPROL-XL) 25 MG 24 hr tablet TAKE 1 TABLET BY MOUTH EVERY DAY (Patient taking differently: Take 25 mg by mouth daily. )  . PRESCRIPTION MEDICATION CPAP: At bedtime only     Allergies:   Patient has no known allergies.   Social History   Socioeconomic History  . Marital status: Married    Spouse name: Not on file  . Number of children: Not on file  . Years of education: Not on file  . Highest education level: Not on file  Occupational History  . Not on file  Social Needs  . Financial resource strain: Not on file  . Food insecurity:    Worry: Not on file    Inability: Not on file  . Transportation needs:    Medical: Not on file    Non-medical: Not on file    Tobacco Use  . Smoking status: Former Smoker    Last attempt to quit: 12/25/1994    Years since quitting: 23.7  . Smokeless tobacco: Never Used  Substance and Sexual Activity  . Alcohol use: Yes    Alcohol/week: 6.0 standard drinks    Types: 6 Cans of beer per week  . Drug use: No  . Sexual activity: Yes  Lifestyle  . Physical activity:    Days per week: Not on file    Minutes per session: Not on file  . Stress: Not on file  Relationships  . Social connections:    Talks on phone: Not on file    Gets together: Not on file    Attends religious service: Not on file    Active member of club or organization: Not on file    Attends meetings of clubs or organizations: Not on file    Relationship status: Not on file  Other  Topics Concern  . Not on file  Social History Narrative  . Not on file     Family History: The patient's family history includes Cancer in his father; Diabetes in his brother and father; Heart attack in his unknown relative; Hypertension in his mother; Stroke in his father.  ROS:   Please see the history of present illness.    No fevers chills nausea vomiting syncope bleeding all other systems reviewed and are negative.  EKGs/Labs/Other Studies Reviewed:    The following studies were reviewed today:  Right/Left heart catheterization 09/17/18:  LV end diastolic pressure is normal. LVEDP 13 mm Hg.  There is no aortic valve stenosis.  No angiographically apparent coronary artery disease.  Normal right heart pressures. PA pressure 27/16, mean PA pressure 20 mm Hg; PCWP 19/15, mean 14 mm Hg; Ao sat 96%, PA sat 70%; CO 6.3 L/min; CI 2.6  The left ventricular ejection fraction is 55-65% by visual estimate.  The left ventricular systolic function is normal.  Right radial loop noted. Patient did tolerate straightening of the loop. Consider alternate access site if cath needed in the future.   Continue preventive therapy and risk factor modification  EKG:   EKG is  ordered today.  The ekg ordered today demonstrates 09/27/2018-sinus bradycardia 48 incomplete left bundle branch block personally viewed.  Recent Labs: 09/16/2018: ALT 26; TSH 2.468 09/17/2018: BUN 18; Creatinine, Ser 0.90; Hemoglobin 14.3; Platelets 182; Potassium 4.4; Sodium 141  Recent Lipid Panel    Component Value Date/Time   CHOL 165 09/17/2018 0550   TRIG 139 09/17/2018 0550   HDL 41 09/17/2018 0550   CHOLHDL 4.0 09/17/2018 0550   VLDL 28 09/17/2018 0550   LDLCALC 96 09/17/2018 0550    Physical Exam:    VS:  BP 130/80   Pulse (!) 48   Ht 5\' 8"  (1.727 m)   Wt 298 lb 6.4 oz (135.4 kg)   BMI 45.37 kg/m     Wt Readings from Last 3 Encounters:  09/27/18 298 lb 6.4 oz (135.4 kg)  09/17/18 293 lb 12.8 oz (133.3 kg)  05/03/18 294 lb 6.4 oz (133.5 kg)     GEN:  Well nourished, well developed in no acute distress, obese HEENT: Normal NECK: No JVD; No carotid bruits LYMPHATICS: No lymphadenopathy CARDIAC: brady RR, no murmurs, rubs, gallops RESPIRATORY:  Clear to auscultation without rales, wheezing or rhonchi  ABDOMEN: Soft, non-tender, non-distended MUSCULOSKELETAL:  No edema; No deformity  SKIN: Warm and dry NEUROLOGIC:  Alert and oriented x 3 PSYCHIATRIC:  Normal affect   ASSESSMENT:    1. Atrial fibrillation, unspecified type (HCC)   2. Chronic systolic heart failure (HCC)   3. S/P ablation of atrial flutter   4. Morbid obesity (HCC)    PLAN:    In order of problems listed above:  Paroxysmal atrial fibrillation Morbid obesity History of cardiomyopathy, resolved Chest pain-noncardiac - We had lengthy discussion about this today.  His atrial fibrillation did not start until after his heart catheterization in the hospital.  He has been feeling some palpitations occasionally at home and had a feeling that he was going in and out of this.  Certainly he may be symptomatic from this.  About a few days ago, he started to feel better.  I wonder if he auto  converted to his sinus bradycardia as he is now.  I did express to him however that his chest discomfort that he was feeling was not associated with atrial fibrillation and  nor is it associated with any coronary artery disease.  Thankfully, he has normal right-sided pressures as well given his morbid obesity.  We had a lengthy talk today about the need for weight loss especially with his paroxysmal atrial fibrillation.  He will be seeing Dr. Elberta Fortis in November.  Antiarrhythmics may be a possibility if he is convinced that he is having symptomatic atrial fibrillation however his resting heart rate is 48 today.  Ablation with atrial fibrillation may be a possibility as well.  Reassuring heart cath, normal EF.   Medication Adjustments/Labs and Tests Ordered: Current medicines are reviewed at length with the patient today.  Concerns regarding medicines are outlined above.  Orders Placed This Encounter  Procedures  . EKG 12-Lead   No orders of the defined types were placed in this encounter.   Patient Instructions  Your physician recommends that you continue on your current medications as directed. Please refer to the Current Medication list given to you today.  Your physician wants you to follow-up in: 6  MONTHS WITH DR Anne Fu You will receive a reminder letter in the mail two months in advance. If you don't receive a letter, please call our office to schedule the follow-up appointment.  KEEP APPOINTMENT WITH DR Elberta Fortis     Signed, [name], MD  09/27/2018 10:09 AM    [facility]

## 2018-10-01 ENCOUNTER — Ambulatory Visit: Payer: PPO | Admitting: Physician Assistant

## 2018-10-08 ENCOUNTER — Ambulatory Visit: Payer: PPO | Admitting: Cardiology

## 2018-10-11 ENCOUNTER — Encounter: Payer: Self-pay | Admitting: *Deleted

## 2018-10-31 ENCOUNTER — Encounter

## 2018-10-31 ENCOUNTER — Ambulatory Visit: Payer: PPO | Admitting: Cardiology

## 2018-10-31 ENCOUNTER — Encounter: Payer: Self-pay | Admitting: Cardiology

## 2018-10-31 VITALS — BP 120/70 | HR 67 | Ht 68.0 in | Wt 288.0 lb

## 2018-10-31 DIAGNOSIS — I483 Typical atrial flutter: Secondary | ICD-10-CM | POA: Diagnosis not present

## 2018-10-31 DIAGNOSIS — I480 Paroxysmal atrial fibrillation: Secondary | ICD-10-CM | POA: Diagnosis not present

## 2018-10-31 DIAGNOSIS — I428 Other cardiomyopathies: Secondary | ICD-10-CM | POA: Diagnosis not present

## 2018-10-31 DIAGNOSIS — G4733 Obstructive sleep apnea (adult) (pediatric): Secondary | ICD-10-CM

## 2018-10-31 NOTE — Progress Notes (Signed)
Electrophysiology Office Note   Date:  10/31/2018   ID:  Jerome [facility], DOB February 20, 1951, MRN [id number]  PCP:  Merri Brunette, MD  Cardiologist:  Margot Chimes Primary Electrophysiologist:  Regan Lemming, MD    No chief complaint on file.    History of Present Illness: Jerome Kent is a 67 y.o. male who presents today for electrophysiology evaluation.   He has a history of obstructive sleep apnea on CPAP, hyperlipidemia, atrial flutter, and heart failure with prior EF of 15%. Cardiac cath in 2014 showed no coronary disease. He presented to the office on 1/15 feeling dizzy,with shortness of breath on exertion. He was found to be in atrial flutter and subsequent had a TEE and cardioversion. Ablation for atrial flutter 02/09/17.  He had a repeat echocardiogram that showed an ejection fraction improvement to 60 to 65%.  Today, denies symptoms of palpitations, chest pain, shortness of breath, orthopnea, PND, lower extremity edema, claudication, dizziness, presyncope, syncope, bleeding, or neurologic sequela. The patient is tolerating medications without difficulties.  He is overall feeling well.  He did present to the hospital with chest pain.  Left heart catheterization showed no evidence of coronary disease.  He had atrial fibrillation after his catheterization and converted on his own.  Since that time he is felt well without no major complaints.   Past Medical History:  Diagnosis Date  . Acute systolic heart failure (HCC)   . Atrial flutter (HCC)   . Cardiomyopathy (HCC)   . Chronic systolic heart failure (HCC)    EF 15% on TEE 9/14 during cardioversion  . Dysrhythmia    atrial flutter  . Erectile dysfunction   . Hyperlipidemia   . Obesity   . OSA (obstructive sleep apnea)   . Osteoarthritis   . Shortness of breath   . Sleep apnea    Past Surgical History:  Procedure Laterality Date  . A-FLUTTER ABLATION N/A 02/09/2017   Procedure: A-Flutter Ablation;   Surgeon: Tyquisha Sharps Jorja Loa, MD;  [facility] INVASIVE CV LAB;  Service: Cardiovascular;  Laterality: N/A;  . APPENDECTOMY    . CARDIOVERSION N/A 09/15/2013   Procedure: CARDIOVERSION;  Surgeon: [name], MD;  Location: [facility] ENDOSCOPY;  Service: Cardiovascular;  Laterality: N/A;  . CARDIOVERSION N/A 01/11/2017   Procedure: CARDIOVERSION;  Surgeon: Jake Bathe, MD;  Location: [facility] ENDOSCOPY;  Service: Cardiovascular;  Laterality: N/A;  . LEFT HEART CATHETERIZATION WITH CORONARY ANGIOGRAM N/A 10/29/2013   Procedure: LEFT HEART CATHETERIZATION WITH CORONARY ANGIOGRAM;  Surgeon: [name], MD;  Location: [facility] CATH LAB;  Service: Cardiovascular;  Laterality: N/A;  . RIGHT/LEFT HEART CATH AND CORONARY ANGIOGRAPHY N/A 09/17/2018   Procedure: RIGHT/LEFT HEART CATH AND CORONARY ANGIOGRAPHY;  Surgeon: Corky Crafts, MD;  Location: [facility] INVASIVE CV LAB;  Service: Cardiovascular;  Laterality: N/A;  . TEE WITHOUT CARDIOVERSION N/A 09/15/2013   Procedure: TRANSESOPHAGEAL ECHOCARDIOGRAM (TEE);  Surgeon: [name], MD;  Location: [facility] ENDOSCOPY;  Service: Cardiovascular;  Laterality: N/A;  . TEE WITHOUT CARDIOVERSION N/A 01/11/2017   Procedure: TRANSESOPHAGEAL ECHOCARDIOGRAM (TEE);  Surgeon: Jake Bathe, MD;  Location: [facility] ENDOSCOPY;  Service: Cardiovascular;  Laterality: N/A;  . TOTAL HIP ARTHROPLASTY Right 2012  . ULTRASOUND GUIDANCE FOR VASCULAR ACCESS  09/17/2018   Procedure: Ultrasound Guidance For Vascular Access;  Surgeon: Corky Crafts, MD;  Location: [facility] INVASIVE CV LAB;  Service: Cardiovascular;;     Current Outpatient Medications  Medication Sig Dispense Refill  . amoxicillin (AMOXIL) 500 MG capsule Take 2,000 mg by mouth  See admin instructions. Take 2,000 mg by mouth one hour prior to dental procedures    . apixaban (ELIQUIS) 5 MG TABS tablet Take 1 tablet (5 mg total) by mouth 2 (two) times daily. 60 tablet 3  . atorvastatin (LIPITOR) 40 MG tablet Take 1 tablet (40 mg total) by mouth  daily. 90 tablet 3  . Cyanocobalamin (B-12 PO) Take 1 tablet by mouth daily.    . furosemide (LASIX) 20 MG tablet TAKE 1 TABLET BY MOUTH EVERY DAY 90 tablet 2  . lisinopril (PRINIVIL,ZESTRIL) 5 MG tablet TAKE 1 TABLET BY MOUTH EVERY DAY 90 tablet 2  . metoprolol succinate (TOPROL-XL) 25 MG 24 hr tablet TAKE 1 TABLET BY MOUTH EVERY DAY 90 tablet 3  . PRESCRIPTION MEDICATION CPAP: At bedtime only     No current facility-administered medications for this visit.     Allergies:   Patient has no known allergies.   Social History:  The patient  reports that he quit smoking about 23 years ago. He has never used smokeless tobacco. He reports that he drinks about 6.0 standard drinks of alcohol per week. He reports that he does not use drugs.   Family History:  The patient's family history includes Cancer in his father; Diabetes in his brother and father; Heart attack in his unknown relative; Hypertension in his mother; Stroke in his father.    ROS:  Please see the history of present illness.   Otherwise, review of systems is positive for chest pain, shortness of breath.   All other systems are reviewed and negative.   PHYSICAL EXAM: VS:  BP 120/70   Pulse 67   Ht 5\' 8"  (1.727 m)   Wt 288 lb (130.6 kg)   SpO2 94%   BMI 43.79 kg/m  , BMI Body mass index is 43.79 kg/m. GEN: Well nourished, well developed, in no acute distress  HEENT: normal  Neck: no JVD, carotid bruits, or masses Cardiac: RRR; no murmurs, rubs, or gallops,no edema  Respiratory:  clear to auscultation bilaterally, normal work of breathing GI: soft, nontender, nondistended, + BS MS: no deformity or atrophy  Skin: warm and dry Neuro:  Strength and sensation are intact Psych: euthymic mood, full affect  EKG:  EKG is not ordered today. Personal review of the ekg ordered 09/27/18 shows sinus rhythm, rate 48, incomplete left bundle branch block  Recent Labs: 09/16/2018: ALT 26; TSH 2.468 09/17/2018: BUN 18; Creatinine, Ser  0.90; Hemoglobin 14.3; Platelets 182; Potassium 4.4; Sodium 141    Lipid Panel     Component Value Date/Time   CHOL 165 09/17/2018 0550   TRIG 139 09/17/2018 0550   HDL 41 09/17/2018 0550   CHOLHDL 4.0 09/17/2018 0550   VLDL 28 09/17/2018 0550   LDLCALC 96 09/17/2018 0550     Wt Readings from Last 3 Encounters:  10/31/18 288 lb (130.6 kg)  09/27/18 298 lb 6.4 oz (135.4 kg)  09/17/18 293 lb 12.8 oz (133.3 kg)      Other studies Reviewed: Additional studies/ records that were reviewed today include: TEE 06/15/18  Review of the above records today demonstrates:   - Left ventricle: The cavity size was normal. Wall thickness was   normal. Systolic function was normal. The estimated ejection   fraction was in the range of 60% to 65%. Wall motion was normal;   there were no regional wall motion abnormalities. Features are   consistent with a pseudonormal left ventricular filling pattern,   with concomitant abnormal relaxation  and increased filling   pressure (grade 2 diastolic dysfunction). - Aortic valve: Mildly calcified annulus. - Mitral valve: There was mild regurgitation.  RHC/LHC 09/17/18  LV end diastolic pressure is normal. LVEDP 13 mm Hg.  There is no aortic valve stenosis.  No angiographically apparent coronary artery disease.  Normal right heart pressures. PA pressure 27/16, mean PA pressure 20 mm Hg; PCWP 19/15, mean 14 mm Hg; Ao sat 96%, PA sat 70%; CO 6.3 L/min; CI 2.6  The left ventricular ejection fraction is 55-65% by visual estimate.  The left ventricular systolic function is normal.  Right radial loop noted. Patient did tolerate straightening of the loop. Consider alternate access site if cath needed in the future.  ASSESSMENT AND PLAN:  1.  Atrial flutter, typical: This post ablation 02/09/2017 without recurrence.  2. Nonischemic cardiomyopathy: Action fraction was 30 to 35%.  Repeat echo shows a great improvement in his ejection fraction to normal.   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  3. Obesity: Weight loss encouraged  4. Sleep apnea: BiPAP compliance encouraged  5.  Paroxysmal atrial fibrillation: Currently on Eliquis.  Occurred post left heart catheterization.  He is in sinus rhythm today.  Kohana Amble not start further medications at this time since he is feeling well and has not had atrial fibrillation since then to his knowledge.  Potentially stop his anticoagulation at the next visit.  This patients CHA2DS2-VASc Score and unadjusted Ischemic Stroke Rate (% per year) is equal to 2.2 % stroke rate/year from a score of 2  Above score calculated as 1 point each if present [CHF, HTN, DM, Vascular=MI/PAD/Aortic Plaque, Age if 65-74, or Male] Above score calculated as 2 points each if present [Age > 75, or Stroke/TIA/TE]    Current medicines are reviewed at length with the patient today.   The patient does not have concerns regarding his medicines.  The following changes were made today:  none  Labs/ tests ordered today include:  No orders of the defined types were placed in this encounter.  Case discussed with primary cardiology Disposition:   FU with Nyheim Seufert 6 months  Signed, Lanell Dubie Jorja Loa, MD  10/31/2018 11:31 AM     [facility] [address] [place] [address] [phone] (office) [phone] (fax)

## 2018-10-31 NOTE — Patient Instructions (Addendum)
Medication Instructions:  Your physician recommends that you continue on your current medications as directed. Please refer to the Current Medication list given to you today.  If you need a refill on your cardiac medications before your next appointment, please call your pharmacy.   Lab work: None ordered  Testing/Procedures: None ordered  Follow-Up: At [facility], you and your health needs are our priority.  As part of our continuing mission to provide you with exceptional heart care, we have created designated Provider Care Teams.  These Care Teams include your primary Cardiologist (physician) and Advanced Practice Providers (APPs -  Physician Assistants and Nurse Practitioners) who all work together to provide you with the care you need, when you need it. You will need a follow up appointment in 6 months.  Please call our office 2 months in advance to schedule this appointment.  You may see Dr. Elberta Fortis or one of the following Advanced Practice Providers on your designated Care Team:   Gypsy Balsam, NP . Francis Dowse, PA-C  Thank you for choosing [facility]!!   Dory Horn, RN [phone]  Any Other Special Instructions Will Be Listed Below (If Applicable).

## 2018-11-08 ENCOUNTER — Telehealth: Payer: Self-pay | Admitting: *Deleted

## 2018-11-08 NOTE — Telephone Encounter (Signed)
[email or web address]

## 2018-11-08 NOTE — Telephone Encounter (Signed)
-----   Message from Will Jorja Loa, MD sent at 11/04/2018  9:37 AM EST ----- Patient stroke risk is low and thus can stop his anticoagulation.  If he has further episodes of atrial fibrillation, will be address anti-coagulation at that time. ----- Message ----- From: Jake Bathe, MD Sent: 11/01/2018   4:56 PM EST To: Regan Lemming, MD  I wound not be opposed to stopping anticoagulation (Age only RF since EF has now improved) Loraine Leriche ----- Message ----- From: Regan Lemming, MD Sent: 10/31/2018  11:31 AM EST To: Jake Bathe, MD  I saw this patient today.  He is still in sinus rhythm and feeling well.  I looked back over his stroke risk factors and it looks like he is low risk.  What you think about stopping his anticoagulation?

## 2018-11-11 NOTE — Telephone Encounter (Signed)
Informed pt he may stop Eliquis. Patient verbalized understanding and agreeable to plan.

## 2018-11-11 NOTE — Telephone Encounter (Signed)
° ° °  Please call patient on mobile number

## 2019-02-10 DIAGNOSIS — A084 Viral intestinal infection, unspecified: Secondary | ICD-10-CM | POA: Diagnosis not present

## 2019-03-28 DIAGNOSIS — J302 Other seasonal allergic rhinitis: Secondary | ICD-10-CM | POA: Diagnosis not present

## 2019-05-14 ENCOUNTER — Encounter: Payer: Self-pay | Admitting: Cardiology

## 2019-05-14 ENCOUNTER — Telehealth (INDEPENDENT_AMBULATORY_CARE_PROVIDER_SITE_OTHER): Payer: PPO | Admitting: Cardiology

## 2019-05-14 ENCOUNTER — Other Ambulatory Visit: Payer: Self-pay

## 2019-05-14 VITALS — BP 140/93 | HR 62 | Ht 68.0 in | Wt 297.0 lb

## 2019-05-14 DIAGNOSIS — I428 Other cardiomyopathies: Secondary | ICD-10-CM

## 2019-05-14 DIAGNOSIS — I4892 Unspecified atrial flutter: Secondary | ICD-10-CM

## 2019-05-14 DIAGNOSIS — Z8679 Personal history of other diseases of the circulatory system: Secondary | ICD-10-CM

## 2019-05-14 DIAGNOSIS — I480 Paroxysmal atrial fibrillation: Secondary | ICD-10-CM

## 2019-05-14 DIAGNOSIS — G4733 Obstructive sleep apnea (adult) (pediatric): Secondary | ICD-10-CM

## 2019-05-14 NOTE — Patient Instructions (Signed)
Medication Instructions:  Your physician recommends that you continue on your current medications as directed. Please refer to the Current Medication list given to you today.  If you need a refill on your cardiac medications before your next appointment, please call your pharmacy.   Lab work: None If you have labs (blood work) drawn today and your tests are completely normal, you will receive your results only by: . MyChart Message (if you have MyChart) OR . A paper copy in the mail If you have any lab test that is abnormal or we need to change your treatment, we will call you to review the results.  Testing/Procedures: None  Follow-Up: At [facility], you and your health needs are our priority.  As part of our continuing mission to provide you with exceptional heart care, we have created designated Provider Care Teams.  These Care Teams include your primary Cardiologist (physician) and Advanced Practice Providers (APPs -  Physician Assistants and Nurse Practitioners) who all work together to provide you with the care you need, when you need it. You will need a follow up appointment in 6 months.  Please call our office 2 months in advance to schedule this appointment.  You may see Mark Skains, MD or one of the following Advanced Practice Providers on your designated Care Team:   Lori Gerhardt, NP Laura Ingold, NP . Jill McDaniel, NP  Any Other Special Instructions Will Be Listed Below (If Applicable).    

## 2019-05-14 NOTE — Progress Notes (Signed)
Virtual Visit via Video Note   This visit type was conducted due to national recommendations for restrictions regarding the COVID-19 Pandemic (e.g. social distancing) in an effort to limit this patient's exposure and mitigate transmission in our community.  Due to his co-morbid illnesses, this patient is at least at moderate risk for complications without adequate follow up.  This format is felt to be most appropriate for this patient at this time.  All issues noted in this document were discussed and addressed.  A limited physical exam was performed with this format.  Please refer to the patient's chart for his consent to telehealth for [employer] [facility]   Date:  05/14/2019   ID:  Jerome Kent, DOB [birth year], MRN [id number]  Patient Location: Home Provider Location: Home  PCP:  Merri Brunette, MD  Cardiologist:  [name], MD  Electrophysiologist:  None   Evaluation Performed:  Follow-Up Visit  Chief Complaint: Prior tachycardia mediated cardiomyopathy  History of Present Illness:    Jerome Kent is a 68 y.o. 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  Had atrial flutter ablation.  Brief episode of atrial fibrillation during cardiac catheterization in 2019.  He has not had any subsequent evidence of arrhythmia.  He and his wife went and had a COVID-19 test for free at the [employer] in his neighborhood and it was negative.  The patient does not have symptoms concerning for COVID-19 infection (fever, chills, cough, or new shortness of breath).    Past Medical History:  Diagnosis Date  . Acute systolic heart failure (HCC)   . Atrial flutter (HCC)   . Cardiomyopathy (HCC)   . Chronic systolic heart failure (HCC)    EF 15% on TEE 9/14 during cardioversion  . Dysrhythmia    atrial flutter  . Erectile dysfunction   .  Hyperlipidemia   . Obesity   . OSA (obstructive sleep apnea)   . Osteoarthritis   . Shortness of breath   . Sleep apnea    Past Surgical History:  Procedure Laterality Date  . A-FLUTTER ABLATION N/A 02/09/2017   Procedure: A-Flutter Ablation;  Surgeon: Will Jorja Loa, MD;  Location: [facility] INVASIVE CV LAB;  Service: Cardiovascular;  Laterality: N/A;  . APPENDECTOMY    . CARDIOVERSION N/A 09/15/2013   Procedure: CARDIOVERSION;  Surgeon: [name], MD;  Location: [facility] ENDOSCOPY;  Service: Cardiovascular;  Laterality: N/A;  . CARDIOVERSION N/A 01/11/2017   Procedure: CARDIOVERSION;  Surgeon: Jake Bathe, MD;  Location: [facility] ENDOSCOPY;  Service: Cardiovascular;  Laterality: N/A;  . LEFT HEART CATHETERIZATION WITH CORONARY ANGIOGRAM N/A 10/29/2013   Procedure: LEFT HEART CATHETERIZATION WITH CORONARY ANGIOGRAM;  Surgeon: [name], MD;  Location: [facility] CATH LAB;  Service: Cardiovascular;  Laterality: N/A;  . RIGHT/LEFT HEART CATH AND CORONARY ANGIOGRAPHY N/A 09/17/2018   Procedure: RIGHT/LEFT HEART CATH AND CORONARY ANGIOGRAPHY;  Surgeon: Corky Crafts, MD;  Location: [facility] INVASIVE CV LAB;  Service: Cardiovascular;  Laterality: N/A;  . TEE WITHOUT CARDIOVERSION N/A 09/15/2013   Procedure: TRANSESOPHAGEAL ECHOCARDIOGRAM (TEE);  Surgeon: [name], MD;  Location: [facility] ENDOSCOPY;  Service: Cardiovascular;  Laterality: N/A;  . TEE WITHOUT CARDIOVERSION N/A 01/11/2017   Procedure: TRANSESOPHAGEAL ECHOCARDIOGRAM (TEE);  Surgeon: Jake Bathe, MD;  Location: [facility] ENDOSCOPY;  Service: Cardiovascular;  Laterality: N/A;  . TOTAL HIP ARTHROPLASTY Right 2012  . ULTRASOUND GUIDANCE FOR VASCULAR ACCESS  09/17/2018   Procedure: Ultrasound Guidance For Vascular Access;  Surgeon: Corky Crafts, MD;  Location: [facility] INVASIVE CV LAB;  Service: Cardiovascular;;     Current Meds  Medication Sig  . amoxicillin (AMOXIL) 500 MG capsule Take 2,000 mg by mouth See admin instructions. Take 2,000 mg by mouth one hour  prior to dental procedures  . atorvastatin (LIPITOR) 40 MG tablet Take 1 tablet (40 mg total) by mouth daily.  . Cyanocobalamin (B-12 PO) Take 1 tablet by mouth daily.  . furosemide (LASIX) 20 MG tablet TAKE 1 TABLET BY MOUTH EVERY DAY  . lisinopril (PRINIVIL,ZESTRIL) 5 MG tablet TAKE 1 TABLET BY MOUTH EVERY DAY  . metoprolol succinate (TOPROL-XL) 25 MG 24 hr tablet TAKE 1 TABLET BY MOUTH EVERY DAY  . PRESCRIPTION MEDICATION CPAP: At bedtime only     Allergies:   Patient has no known allergies.   Social History   Tobacco Use  . Smoking status: Former Smoker    Last attempt to quit: 12/25/1994    Years since quitting: 24.4  . Smokeless tobacco: Never Used  Substance Use Topics  . Alcohol use: Yes    Alcohol/week: 6.0 standard drinks    Types: 6 Cans of beer per week  . Drug use: No     Family Hx: The patient's family history includes Cancer in his father; Diabetes in his brother and father; Heart attack in his unknown relative; Hypertension in his mother; Stroke in his father.  ROS:   Please see the history of present illness.    Denies any chest pain fevers chills nausea vomiting syncope orthopnea PND.  Compliant with CPAP mask. All other systems reviewed and are negative.   Prior CV studies:   The following studies were reviewed today:  Cath 09/17/18:  LV end diastolic pressure is normal. LVEDP 13 mm Hg.  There is no aortic valve stenosis.  No angiographically apparent coronary artery disease.  Normal right heart pressures. PA pressure 27/16, mean PA pressure 20 mm Hg; PCWP 19/15, mean 14 mm Hg; Ao sat 96%, PA sat 70%; CO 6.3 L/min; CI 2.6  The left ventricular ejection fraction is 55-65% by visual estimate.  The left ventricular systolic function is normal.  Right radial loop noted. Patient did tolerate straightening of the loop. Consider alternate access site if cath needed in the future.     Labs/Other Tests and Data Reviewed:    EKG:  An ECG dated 09/27/18  was personally reviewed today and demonstrated:  Sinus bradycardia rate 49  Recent Labs: 09/16/2018: ALT 26; TSH 2.468 09/17/2018: BUN 18; Creatinine, Ser 0.90; Hemoglobin 14.3; Platelets 182; Potassium 4.4; Sodium 141   Recent Lipid Panel Lab Results  Component Value Date/Time   CHOL 165 09/17/2018 05:50 AM   TRIG 139 09/17/2018 05:50 AM   HDL 41 09/17/2018 05:50 AM   CHOLHDL 4.0 09/17/2018 05:50 AM   LDLCALC 96 09/17/2018 05:50 AM    Wt Readings from Last 3 Encounters:  05/14/19 297 lb (134.7 kg)  10/31/18 288 lb (130.6 kg)  09/27/18 298 lb 6.4 oz (135.4 kg)     Objective:    Vital Signs:  BP (!) 140/93   Pulse 62   Ht  (1.727 m)   Wt 297 lb (134.7 kg)   BMI 45.16 kg/m    VITAL SIGNS:  reviewed GEN:  no acute distress EYES:  sclerae anicteric, EOMI - Extraocular Movements Intact RESPIRATORY:  normal respiratory effort, symmetric expansion SKIN:  no rash, lesions  or ulcers. MUSCULOSKELETAL:  no obvious deformities. NEURO:  alert and oriented x 3, no obvious focal deficit PSYCH:  normal affect  ASSESSMENT & PLAN:    Chronic systolic heart failure-improved EF - Well compensated, NYHA class I.  Prior EF 35% now 65%.  Doing well.  Medications reviewed.  Low-dose lisinopril and Toprol secondary to relative hypotension.  Well compensated. -Likely had a tachycardia mediated in part cardiomyopathy.  No CAD on catheterization 2019.  Obstructive sleep apnea -Continue to wear CPAP.  Excellent compliance.  Morbid obesity -Continue to encourage weight loss.  Ongoing issue for him.  Challenging.  Discussed decreasing carbohydrates again.  Atrial flutter/fibrillation - Has maintained sinus rhythm for several months post catheterization.  Post ablation for atrial flutter, Dr. Elberta Fortis.  Overall doing well.  He is off of anticoagulation.   COVID-19 Education: The signs and symptoms of COVID-19 were discussed with the patient and how to seek care for testing (follow up with  PCP or arrange E-visit).  The importance of social distancing was discussed today.  Time:   Today, I have spent 11 minutes with the patient with telehealth technology discussing the above problems.     Medication Adjustments/Labs and Tests Ordered: Current medicines are reviewed at length with the patient today.  Concerns regarding medicines are outlined above.   Tests Ordered: No orders of the defined types were placed in this encounter.   Medication Changes: No orders of the defined types were placed in this encounter.   Disposition:  Follow up in 6 month(s)  Signed, [name], MD  05/14/2019 2:47 PM    [facility]

## 2019-05-16 ENCOUNTER — Telehealth: Payer: Self-pay | Admitting: *Deleted

## 2019-05-16 NOTE — Telephone Encounter (Signed)
-----   Message from Will Jorja Loa, MD sent at 05/16/2019 11:59 AM EDT ----- Regarding: RE: Follow up? Yes ok to see as needed. ----- Message ----- From: Baird Lyons, RN Sent: 05/16/2019  11:09 AM EDT To: Regan Lemming, MD, Baird Lyons, RN Subject: Follow up?                                     Please advise on f/u with you.  Pt recall for 04/2019, 34mo f/u Do you need to see pt again or just have Skains refer back if needed?  Thx ----- Message ----- From: Julio Sicks, LPN Sent: 2/92/4462   2:02 PM EDT To: Baird Lyons, RN  Dr. Anne Fu seen this pt today virtually.  He said pt was doing fine and ok to cancel [facility] appt for 5/29.  I cancelled the appt and then thought I better tell you just in case that appt was needed for any other reason than just a follow up.  Victorino Dike

## 2019-05-16 NOTE — Telephone Encounter (Signed)
Left detailed message informing pt he was PRN f/u w/ Camnitz going forward. Advised to call back if he preferred to follow up again, otherwise notified that [facility] can refer back if needed.

## 2019-05-22 ENCOUNTER — Other Ambulatory Visit: Payer: Self-pay | Admitting: Cardiology

## 2019-05-23 ENCOUNTER — Telehealth: Payer: PPO | Admitting: Cardiology

## 2019-06-02 DIAGNOSIS — G4733 Obstructive sleep apnea (adult) (pediatric): Secondary | ICD-10-CM | POA: Diagnosis not present

## 2019-06-09 ENCOUNTER — Other Ambulatory Visit: Payer: Self-pay | Admitting: Cardiology

## 2019-06-09 DIAGNOSIS — I5022 Chronic systolic (congestive) heart failure: Secondary | ICD-10-CM

## 2019-09-03 DIAGNOSIS — H6092 Unspecified otitis externa, left ear: Secondary | ICD-10-CM | POA: Diagnosis not present

## 2019-10-01 DIAGNOSIS — M9902 Segmental and somatic dysfunction of thoracic region: Secondary | ICD-10-CM | POA: Diagnosis not present

## 2019-10-01 DIAGNOSIS — M5135 Other intervertebral disc degeneration, thoracolumbar region: Secondary | ICD-10-CM | POA: Diagnosis not present

## 2019-10-01 DIAGNOSIS — M5136 Other intervertebral disc degeneration, lumbar region: Secondary | ICD-10-CM | POA: Diagnosis not present

## 2019-10-01 DIAGNOSIS — M9903 Segmental and somatic dysfunction of lumbar region: Secondary | ICD-10-CM | POA: Diagnosis not present

## 2019-10-01 DIAGNOSIS — M9904 Segmental and somatic dysfunction of sacral region: Secondary | ICD-10-CM | POA: Diagnosis not present

## 2019-10-01 DIAGNOSIS — M5137 Other intervertebral disc degeneration, lumbosacral region: Secondary | ICD-10-CM | POA: Diagnosis not present

## 2019-10-02 DIAGNOSIS — M5135 Other intervertebral disc degeneration, thoracolumbar region: Secondary | ICD-10-CM | POA: Diagnosis not present

## 2019-10-02 DIAGNOSIS — M5136 Other intervertebral disc degeneration, lumbar region: Secondary | ICD-10-CM | POA: Diagnosis not present

## 2019-10-02 DIAGNOSIS — M9903 Segmental and somatic dysfunction of lumbar region: Secondary | ICD-10-CM | POA: Diagnosis not present

## 2019-10-02 DIAGNOSIS — M5137 Other intervertebral disc degeneration, lumbosacral region: Secondary | ICD-10-CM | POA: Diagnosis not present

## 2019-10-02 DIAGNOSIS — M9904 Segmental and somatic dysfunction of sacral region: Secondary | ICD-10-CM | POA: Diagnosis not present

## 2019-10-02 DIAGNOSIS — M9902 Segmental and somatic dysfunction of thoracic region: Secondary | ICD-10-CM | POA: Diagnosis not present

## 2019-10-03 DIAGNOSIS — M9903 Segmental and somatic dysfunction of lumbar region: Secondary | ICD-10-CM | POA: Diagnosis not present

## 2019-10-03 DIAGNOSIS — M9902 Segmental and somatic dysfunction of thoracic region: Secondary | ICD-10-CM | POA: Diagnosis not present

## 2019-10-03 DIAGNOSIS — M5137 Other intervertebral disc degeneration, lumbosacral region: Secondary | ICD-10-CM | POA: Diagnosis not present

## 2019-10-03 DIAGNOSIS — M9904 Segmental and somatic dysfunction of sacral region: Secondary | ICD-10-CM | POA: Diagnosis not present

## 2019-10-03 DIAGNOSIS — M5136 Other intervertebral disc degeneration, lumbar region: Secondary | ICD-10-CM | POA: Diagnosis not present

## 2019-10-03 DIAGNOSIS — M5135 Other intervertebral disc degeneration, thoracolumbar region: Secondary | ICD-10-CM | POA: Diagnosis not present

## 2019-10-06 DIAGNOSIS — M5136 Other intervertebral disc degeneration, lumbar region: Secondary | ICD-10-CM | POA: Diagnosis not present

## 2019-10-06 DIAGNOSIS — M9904 Segmental and somatic dysfunction of sacral region: Secondary | ICD-10-CM | POA: Diagnosis not present

## 2019-10-06 DIAGNOSIS — M9903 Segmental and somatic dysfunction of lumbar region: Secondary | ICD-10-CM | POA: Diagnosis not present

## 2019-10-06 DIAGNOSIS — M5137 Other intervertebral disc degeneration, lumbosacral region: Secondary | ICD-10-CM | POA: Diagnosis not present

## 2019-10-06 DIAGNOSIS — M9902 Segmental and somatic dysfunction of thoracic region: Secondary | ICD-10-CM | POA: Diagnosis not present

## 2019-10-06 DIAGNOSIS — M5135 Other intervertebral disc degeneration, thoracolumbar region: Secondary | ICD-10-CM | POA: Diagnosis not present

## 2019-10-09 DIAGNOSIS — M5136 Other intervertebral disc degeneration, lumbar region: Secondary | ICD-10-CM | POA: Diagnosis not present

## 2019-10-09 DIAGNOSIS — M5135 Other intervertebral disc degeneration, thoracolumbar region: Secondary | ICD-10-CM | POA: Diagnosis not present

## 2019-10-09 DIAGNOSIS — M9903 Segmental and somatic dysfunction of lumbar region: Secondary | ICD-10-CM | POA: Diagnosis not present

## 2019-10-09 DIAGNOSIS — M5137 Other intervertebral disc degeneration, lumbosacral region: Secondary | ICD-10-CM | POA: Diagnosis not present

## 2019-10-09 DIAGNOSIS — M9904 Segmental and somatic dysfunction of sacral region: Secondary | ICD-10-CM | POA: Diagnosis not present

## 2019-10-09 DIAGNOSIS — M9902 Segmental and somatic dysfunction of thoracic region: Secondary | ICD-10-CM | POA: Diagnosis not present

## 2019-10-10 DIAGNOSIS — M9902 Segmental and somatic dysfunction of thoracic region: Secondary | ICD-10-CM | POA: Diagnosis not present

## 2019-10-10 DIAGNOSIS — M5137 Other intervertebral disc degeneration, lumbosacral region: Secondary | ICD-10-CM | POA: Diagnosis not present

## 2019-10-10 DIAGNOSIS — M5135 Other intervertebral disc degeneration, thoracolumbar region: Secondary | ICD-10-CM | POA: Diagnosis not present

## 2019-10-10 DIAGNOSIS — M9904 Segmental and somatic dysfunction of sacral region: Secondary | ICD-10-CM | POA: Diagnosis not present

## 2019-10-10 DIAGNOSIS — M5136 Other intervertebral disc degeneration, lumbar region: Secondary | ICD-10-CM | POA: Diagnosis not present

## 2019-10-10 DIAGNOSIS — M9903 Segmental and somatic dysfunction of lumbar region: Secondary | ICD-10-CM | POA: Diagnosis not present

## 2019-10-20 DIAGNOSIS — M9902 Segmental and somatic dysfunction of thoracic region: Secondary | ICD-10-CM | POA: Diagnosis not present

## 2019-10-20 DIAGNOSIS — M9903 Segmental and somatic dysfunction of lumbar region: Secondary | ICD-10-CM | POA: Diagnosis not present

## 2019-10-20 DIAGNOSIS — M5136 Other intervertebral disc degeneration, lumbar region: Secondary | ICD-10-CM | POA: Diagnosis not present

## 2019-10-20 DIAGNOSIS — M5135 Other intervertebral disc degeneration, thoracolumbar region: Secondary | ICD-10-CM | POA: Diagnosis not present

## 2019-10-20 DIAGNOSIS — M5137 Other intervertebral disc degeneration, lumbosacral region: Secondary | ICD-10-CM | POA: Diagnosis not present

## 2019-10-20 DIAGNOSIS — M9904 Segmental and somatic dysfunction of sacral region: Secondary | ICD-10-CM | POA: Diagnosis not present

## 2019-10-23 DIAGNOSIS — M5137 Other intervertebral disc degeneration, lumbosacral region: Secondary | ICD-10-CM | POA: Diagnosis not present

## 2019-10-23 DIAGNOSIS — M5136 Other intervertebral disc degeneration, lumbar region: Secondary | ICD-10-CM | POA: Diagnosis not present

## 2019-10-23 DIAGNOSIS — M5135 Other intervertebral disc degeneration, thoracolumbar region: Secondary | ICD-10-CM | POA: Diagnosis not present

## 2019-10-23 DIAGNOSIS — M9903 Segmental and somatic dysfunction of lumbar region: Secondary | ICD-10-CM | POA: Diagnosis not present

## 2019-10-23 DIAGNOSIS — M9904 Segmental and somatic dysfunction of sacral region: Secondary | ICD-10-CM | POA: Diagnosis not present

## 2019-10-23 DIAGNOSIS — M9902 Segmental and somatic dysfunction of thoracic region: Secondary | ICD-10-CM | POA: Diagnosis not present

## 2019-10-27 DIAGNOSIS — M9904 Segmental and somatic dysfunction of sacral region: Secondary | ICD-10-CM | POA: Diagnosis not present

## 2019-10-27 DIAGNOSIS — M5135 Other intervertebral disc degeneration, thoracolumbar region: Secondary | ICD-10-CM | POA: Diagnosis not present

## 2019-10-27 DIAGNOSIS — M5136 Other intervertebral disc degeneration, lumbar region: Secondary | ICD-10-CM | POA: Diagnosis not present

## 2019-10-27 DIAGNOSIS — M9902 Segmental and somatic dysfunction of thoracic region: Secondary | ICD-10-CM | POA: Diagnosis not present

## 2019-10-27 DIAGNOSIS — M9903 Segmental and somatic dysfunction of lumbar region: Secondary | ICD-10-CM | POA: Diagnosis not present

## 2019-10-27 DIAGNOSIS — M5137 Other intervertebral disc degeneration, lumbosacral region: Secondary | ICD-10-CM | POA: Diagnosis not present

## 2019-10-30 DIAGNOSIS — M5136 Other intervertebral disc degeneration, lumbar region: Secondary | ICD-10-CM | POA: Diagnosis not present

## 2019-10-30 DIAGNOSIS — M9902 Segmental and somatic dysfunction of thoracic region: Secondary | ICD-10-CM | POA: Diagnosis not present

## 2019-10-30 DIAGNOSIS — M9903 Segmental and somatic dysfunction of lumbar region: Secondary | ICD-10-CM | POA: Diagnosis not present

## 2019-10-30 DIAGNOSIS — M5135 Other intervertebral disc degeneration, thoracolumbar region: Secondary | ICD-10-CM | POA: Diagnosis not present

## 2019-10-30 DIAGNOSIS — M5137 Other intervertebral disc degeneration, lumbosacral region: Secondary | ICD-10-CM | POA: Diagnosis not present

## 2019-10-30 DIAGNOSIS — M9904 Segmental and somatic dysfunction of sacral region: Secondary | ICD-10-CM | POA: Diagnosis not present

## 2019-11-03 DIAGNOSIS — M5136 Other intervertebral disc degeneration, lumbar region: Secondary | ICD-10-CM | POA: Diagnosis not present

## 2019-11-03 DIAGNOSIS — M5135 Other intervertebral disc degeneration, thoracolumbar region: Secondary | ICD-10-CM | POA: Diagnosis not present

## 2019-11-03 DIAGNOSIS — M9902 Segmental and somatic dysfunction of thoracic region: Secondary | ICD-10-CM | POA: Diagnosis not present

## 2019-11-03 DIAGNOSIS — M9904 Segmental and somatic dysfunction of sacral region: Secondary | ICD-10-CM | POA: Diagnosis not present

## 2019-11-03 DIAGNOSIS — M5137 Other intervertebral disc degeneration, lumbosacral region: Secondary | ICD-10-CM | POA: Diagnosis not present

## 2019-11-03 DIAGNOSIS — M9903 Segmental and somatic dysfunction of lumbar region: Secondary | ICD-10-CM | POA: Diagnosis not present

## 2019-12-17 DIAGNOSIS — I5022 Chronic systolic (congestive) heart failure: Secondary | ICD-10-CM | POA: Diagnosis not present

## 2019-12-17 DIAGNOSIS — E782 Mixed hyperlipidemia: Secondary | ICD-10-CM | POA: Diagnosis not present

## 2019-12-17 DIAGNOSIS — Z125 Encounter for screening for malignant neoplasm of prostate: Secondary | ICD-10-CM | POA: Diagnosis not present

## 2019-12-17 DIAGNOSIS — Z0000 Encounter for general adult medical examination without abnormal findings: Secondary | ICD-10-CM | POA: Diagnosis not present

## 2019-12-17 DIAGNOSIS — G4733 Obstructive sleep apnea (adult) (pediatric): Secondary | ICD-10-CM | POA: Diagnosis not present

## 2019-12-17 DIAGNOSIS — Z23 Encounter for immunization: Secondary | ICD-10-CM | POA: Diagnosis not present

## 2019-12-17 DIAGNOSIS — Z1389 Encounter for screening for other disorder: Secondary | ICD-10-CM | POA: Diagnosis not present

## 2019-12-17 DIAGNOSIS — E669 Obesity, unspecified: Secondary | ICD-10-CM | POA: Diagnosis not present

## 2020-01-01 DIAGNOSIS — Z03818 Encounter for observation for suspected exposure to other biological agents ruled out: Secondary | ICD-10-CM | POA: Diagnosis not present

## 2020-01-27 DIAGNOSIS — Z03818 Encounter for observation for suspected exposure to other biological agents ruled out: Secondary | ICD-10-CM | POA: Diagnosis not present

## 2020-02-08 ENCOUNTER — Ambulatory Visit: Payer: PPO | Attending: Internal Medicine

## 2020-02-08 ENCOUNTER — Ambulatory Visit: Payer: PPO

## 2020-02-08 DIAGNOSIS — Z23 Encounter for immunization: Secondary | ICD-10-CM | POA: Insufficient documentation

## 2020-02-08 NOTE — Progress Notes (Signed)
   Covid-19 Vaccination Clinic  Name:  Leaf [facility]    MRN: [id number] DOB: [birth year]  02/08/2020  Mr. Justen was observed post Covid-19 immunization for 15 minutes without incidence. He was provided with Vaccine Information Sheet and instruction to access the V-Safe system.   Mr. Waldrop was instructed to call 911 with any severe reactions post vaccine: [name] Kitchen Difficulty breathing  . Swelling of your face and throat  . A fast heartbeat  . A bad rash all over your body  . Dizziness and weakness    Immunizations Administered    Name Date Dose VIS Date Route   Pfizer COVID-19 Vaccine 02/08/2020  9:48 AM 0.3 mL 12/05/2019 Intramuscular   Manufacturer: [employer], [employer]   Lot: [id number]   NDC: [address]-8

## 2020-03-02 ENCOUNTER — Ambulatory Visit: Payer: PPO | Attending: Internal Medicine

## 2020-03-02 DIAGNOSIS — Z23 Encounter for immunization: Secondary | ICD-10-CM

## 2020-03-02 NOTE — Progress Notes (Signed)
   Covid-19 Vaccination Clinic  Name:  Saquan Furtick    MRN: [id number] DOB: 1951/10/28  03/02/2020  Mr. Obriant was observed post Covid-19 immunization for 15 minutes without incident. He was provided with Vaccine Information Sheet and instruction to access the V-Safe system.   Mr. Hornstein was instructed to call 911 with any severe reactions post vaccine: [name] Kitchen Difficulty breathing  . Swelling of face and throat  . A fast heartbeat  . A bad rash all over body  . Dizziness and weakness   Immunizations Administered    Name Date Dose VIS Date Route   Pfizer COVID-19 Vaccine 03/02/2020 10:13 AM 0.3 mL 12/05/2019 Intramuscular   Manufacturer: [employer], [employer]   Lot: [id number]   NDC: [address]-5

## 2020-03-11 ENCOUNTER — Other Ambulatory Visit: Payer: Self-pay | Admitting: Cardiology

## 2020-03-11 DIAGNOSIS — I5022 Chronic systolic (congestive) heart failure: Secondary | ICD-10-CM

## 2020-03-18 ENCOUNTER — Ambulatory Visit: Payer: PPO | Admitting: Cardiology

## 2020-03-19 ENCOUNTER — Encounter: Payer: Self-pay | Admitting: Cardiology

## 2020-03-19 ENCOUNTER — Other Ambulatory Visit: Payer: Self-pay

## 2020-03-19 ENCOUNTER — Ambulatory Visit: Payer: PPO | Admitting: Cardiology

## 2020-03-19 VITALS — BP 110/72 | HR 65 | Ht 68.0 in | Wt 306.0 lb

## 2020-03-19 DIAGNOSIS — I428 Other cardiomyopathies: Secondary | ICD-10-CM | POA: Diagnosis not present

## 2020-03-19 DIAGNOSIS — I480 Paroxysmal atrial fibrillation: Secondary | ICD-10-CM | POA: Diagnosis not present

## 2020-03-19 NOTE — Progress Notes (Signed)
Cardiology Office Note:    Date:  03/19/2020   ID:  Jerome Kent, DOB January 24, 1951, MRN [id number]  PCP:  Merri Brunette, MD  Cardiologist:  [name], MD  Electrophysiologist:  None   Referring MD: Merri Brunette, MD     History of Present Illness:    Jerome Kent is a 69 y.o. male here for follow-up of dilated cardiomyopathy, chronic systolic heart failure.  EF had normalized to 65%.  Had atrial flutter ablation in the past.  Brief episode of atrial flutter was noted during cardiac catheterization in 2019.  Overall been doing well.  No chest pain fevers chills nausea vomiting syncope bleeding.  He has gained some extra weight during Covid.  Heaviest he is ever been.  Encouraged weight loss.  Past Medical History:  Diagnosis Date  . Acute systolic heart failure (HCC)   . Atrial flutter (HCC)   . Cardiomyopathy (HCC)   . Chronic systolic heart failure (HCC)    EF 15% on TEE 9/14 during cardioversion  . Dysrhythmia    atrial flutter  . Erectile dysfunction   . Hyperlipidemia   . Obesity   . OSA (obstructive sleep apnea)   . Osteoarthritis   . Shortness of breath   . Sleep apnea     Past Surgical History:  Procedure Laterality Date  . A-FLUTTER ABLATION N/A 02/09/2017   Procedure: A-Flutter Ablation;  Surgeon: Will Jorja Loa, MD;  Location: [facility] INVASIVE CV LAB;  Service: Cardiovascular;  Laterality: N/A;  . APPENDECTOMY    . CARDIOVERSION N/A 09/15/2013   Procedure: CARDIOVERSION;  Surgeon: [name], MD;  Location: [facility] ENDOSCOPY;  Service: Cardiovascular;  Laterality: N/A;  . CARDIOVERSION N/A 01/11/2017   Procedure: CARDIOVERSION;  Surgeon: Jake Bathe, MD;  Location: [facility] ENDOSCOPY;  Service: Cardiovascular;  Laterality: N/A;  . LEFT HEART CATHETERIZATION WITH CORONARY ANGIOGRAM N/A 10/29/2013   Procedure: LEFT HEART CATHETERIZATION WITH CORONARY ANGIOGRAM;  Surgeon: [name], MD;  Location: [facility] CATH LAB;  Service: Cardiovascular;  Laterality: N/A;  .  RIGHT/LEFT HEART CATH AND CORONARY ANGIOGRAPHY N/A 09/17/2018   Procedure: RIGHT/LEFT HEART CATH AND CORONARY ANGIOGRAPHY;  Surgeon: Corky Crafts, MD;  Location: [facility] INVASIVE CV LAB;  Service: Cardiovascular;  Laterality: N/A;  . TEE WITHOUT CARDIOVERSION N/A 09/15/2013   Procedure: TRANSESOPHAGEAL ECHOCARDIOGRAM (TEE);  Surgeon: [name], MD;  Location: [facility] ENDOSCOPY;  Service: Cardiovascular;  Laterality: N/A;  . TEE WITHOUT CARDIOVERSION N/A 01/11/2017   Procedure: TRANSESOPHAGEAL ECHOCARDIOGRAM (TEE);  Surgeon: Jake Bathe, MD;  Location: [facility] ENDOSCOPY;  Service: Cardiovascular;  Laterality: N/A;  . TOTAL HIP ARTHROPLASTY Right 2012  . ULTRASOUND GUIDANCE FOR VASCULAR ACCESS  09/17/2018   Procedure: Ultrasound Guidance For Vascular Access;  Surgeon: Corky Crafts, MD;  Location: [facility] INVASIVE CV LAB;  Service: Cardiovascular;;    Current Medications: Current Meds  Medication Sig  . amoxicillin (AMOXIL) 500 MG capsule Take 2,000 mg by mouth See admin instructions. Take 2,000 mg by mouth one hour prior to dental procedures  . atorvastatin (LIPITOR) 40 MG tablet Take 1 tablet (40 mg total) by mouth daily.  . Cyanocobalamin (B-12 PO) Take 1 tablet by mouth daily.  . furosemide (LASIX) 20 MG tablet TAKE 1 TABLET BY MOUTH EVERY DAY  . lisinopril (ZESTRIL) 5 MG tablet TAKE 1 TABLET BY MOUTH EVERY DAY  . metoprolol succinate (TOPROL-XL) 25 MG 24 hr tablet TAKE 1 TABLET BY MOUTH EVERY DAY  . PRESCRIPTION MEDICATION CPAP: At bedtime only     Allergies:  Patient has no known allergies.   Social History   Socioeconomic History  . Marital status: Significant Other    Spouse name: Not on file  . Number of children: Not on file  . Years of education: Not on file  . Highest education level: Not on file  Occupational History  . Not on file  Tobacco Use  . Smoking status: Former Smoker    Quit date: 12/25/1994    Years since quitting: 25.2  . Smokeless tobacco: Never Used   Substance and Sexual Activity  . Alcohol use: Yes    Alcohol/week: 6.0 standard drinks    Types: 6 Cans of beer per week  . Drug use: No  . Sexual activity: Yes  Other Topics Concern  . Not on file  Social History Narrative  . Not on file   Social Determinants of Health   Financial Resource Strain:   . Difficulty of Paying Living Expenses:   Food Insecurity:   . Worried About Programme researcher, broadcasting/film/video in the Last Year:   . Barista in the Last Year:   Transportation Needs:   . Freight forwarder (Medical):   [name] Kitchen Lack of Transportation (Non-Medical):   Physical Activity:   . Days of Exercise per Week:   . Minutes of Exercise per Session:   Stress:   . Feeling of Stress :   Social Connections:   . Frequency of Communication with Friends and Family:   . Frequency of Social Gatherings with Friends and Family:   . Attends Religious Services:   . Active Member of Clubs or Organizations:   . Attends Banker Meetings:   [name] Kitchen Marital Status:      Family History: The patient's family history includes Cancer in his father; Diabetes in his brother and father; Heart attack in his unknown relative; Hypertension in his mother; Stroke in his father.  ROS:   Please see the history of present illness.     All other systems reviewed and are negative.  EKGs/Labs/Other Studies Reviewed:    The following studies were reviewed today:  Cath 09/17/18:  LV end diastolic pressure is normal. LVEDP 13 mm Hg.  There is no aortic valve stenosis.  No angiographically apparent coronary artery disease.  Normal right heart pressures. PA pressure 27/16, mean PA pressure 20 mm Hg; PCWP 19/15, mean 14 mm Hg; Ao sat 96%, PA sat 70%; CO 6.3 L/min; CI 2.6  The left ventricular ejection fraction is 55-65% by visual estimate.  The left ventricular systolic function is normal.  Right radial loop noted. Patient did tolerate straightening of the loop. Consider alternate access site if  cath needed in the future.   EKG:  EKG is  ordered today.  The ekg ordered today demonstrates sinus rhythm 65 interventricular conduction delay, left axis deviation.  Recent Labs: No results found for requested labs within last 8760 hours.  Recent Lipid Panel    Component Value Date/Time   CHOL 165 09/17/2018 0550   TRIG 139 09/17/2018 0550   HDL 41 09/17/2018 0550   CHOLHDL 4.0 09/17/2018 0550   VLDL 28 09/17/2018 0550   LDLCALC 96 09/17/2018 0550    Physical Exam:    VS:  BP 110/72   Pulse 65   Ht 5\' 8"  (1.727 m)   Wt (!) 306 lb (138.8 kg)   SpO2 96%   BMI 46.53 kg/m     Wt Readings from Last 3 Encounters:  03/19/20 (!) 306  lb (138.8 kg)  05/14/19 297 lb (134.7 kg)  10/31/18 288 lb (130.6 kg)     GEN:  Overweight Well nourished, well developed in no acute distress HEENT: Normal NECK: No JVD; No carotid bruits LYMPHATICS: No lymphadenopathy CARDIAC: RRR, no murmurs, rubs, gallops RESPIRATORY:  Clear to auscultation without rales, wheezing or rhonchi  ABDOMEN: Soft, non-tender, non-distended MUSCULOSKELETAL:  No edema; No deformity  SKIN: Warm and dry NEUROLOGIC:  Alert and oriented x 3 PSYCHIATRIC:  Normal affect   ASSESSMENT:    1. Paroxysmal atrial fibrillation (HCC)   2. Nonischemic cardiomyopathy ([place])   3. Morbid obesity ([place])    PLAN:    In order of problems listed above:  Chronic systolic heart failure-EF improved -Excellent.  NYHA class I.  EF from 35-65.  Continue with low-dose medication.  Relative hypotension in the past. 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  No CAD on catheterization in 2019.  Obstructive sleep apnea -CPAP.  Good compliance.  Morbid obesity -Continue to encourage weight loss.  BMI 46.  Challenging for him.  Atrial flutter/atrial fibrillation paroxysmal -Maintain sinus rhythm for several months post catheterization.  Post ablation as well for atrial flutter by Dr. Curt Bears.  No anticoagulation at this  time.  Obviously if this were to return, we need to reinitiate anticoagulation.  Hyperlipidemia -LDL 96 in 2019 hemoglobin 15.2 creatinine 0.8   Medication Adjustments/Labs and Tests Ordered: Current medicines are reviewed at length with the patient today.  Concerns regarding medicines are outlined above.  Orders Placed This Encounter  Procedures  . EKG 12-Lead   No orders of the defined types were placed in this encounter.   Patient Instructions  Medication Instructions:  The current medical regimen is effective;  continue present plan and medications.  *If you need a refill on your cardiac medications before your next appointment, please call your pharmacy*  Follow-Up: At [facility], you and your health needs are our priority.  As part of our continuing mission to provide you with exceptional heart care, we have created designated Provider Care Teams.  These Care Teams include your primary Cardiologist (physician) and Advanced Practice Providers (APPs -  Physician Assistants and Nurse Practitioners) who all work together to provide you with the care you need, when you need it.  We recommend signing up for the patient portal called "MyChart".  Sign up information is provided on this After Visit Summary.  MyChart is used to connect with patients for Virtual Visits (Telemedicine).  Patients are able to view lab/test results, encounter notes, upcoming appointments, etc.  Non-urgent messages can be sent to your provider as well.   To learn more about what you can do with MyChart, go to [email or web address].    Your next appointment:   12 month(s)  The format for your next appointment:   In Person  Provider:   Candee Furbish, MD   Thank you for choosing [facility]!!        Signed, Candee Furbish, MD  03/19/2020 8:53 AM    Jerome Kent

## 2020-03-19 NOTE — Patient Instructions (Signed)
Medication Instructions:  The current medical regimen is effective;  continue present plan and medications.  *If you need a refill on your cardiac medications before your next appointment, please call your pharmacy*  Follow-Up: At [facility], you and your health needs are our priority.  As part of our continuing mission to provide you with exceptional heart care, we have created designated Provider Care Teams.  These Care Teams include your primary Cardiologist (physician) and Advanced Practice Providers (APPs -  Physician Assistants and Nurse Practitioners) who all work together to provide you with the care you need, when you need it.  We recommend signing up for the patient portal called "MyChart".  Sign up information is provided on this After Visit Summary.  MyChart is used to connect with patients for Virtual Visits (Telemedicine).  Patients are able to view lab/test results, encounter notes, upcoming appointments, etc.  Non-urgent messages can be sent to your provider as well.   To learn more about what you can do with MyChart, go to [email or web address].    Your next appointment:   12 month(s)  The format for your next appointment:   In Person  Provider:   Mark Skains, MD   Thank you for choosing [facility]!!      

## 2020-05-03 DIAGNOSIS — L729 Follicular cyst of the skin and subcutaneous tissue, unspecified: Secondary | ICD-10-CM | POA: Diagnosis not present

## 2020-05-23 ENCOUNTER — Other Ambulatory Visit: Payer: Self-pay | Admitting: Cardiology

## 2020-05-31 DIAGNOSIS — G4733 Obstructive sleep apnea (adult) (pediatric): Secondary | ICD-10-CM | POA: Diagnosis not present

## 2020-06-16 DIAGNOSIS — E782 Mixed hyperlipidemia: Secondary | ICD-10-CM | POA: Diagnosis not present

## 2020-06-16 DIAGNOSIS — I5022 Chronic systolic (congestive) heart failure: Secondary | ICD-10-CM | POA: Diagnosis not present

## 2020-06-16 DIAGNOSIS — E669 Obesity, unspecified: Secondary | ICD-10-CM | POA: Diagnosis not present

## 2020-07-06 DIAGNOSIS — M9903 Segmental and somatic dysfunction of lumbar region: Secondary | ICD-10-CM | POA: Diagnosis not present

## 2020-07-06 DIAGNOSIS — M5137 Other intervertebral disc degeneration, lumbosacral region: Secondary | ICD-10-CM | POA: Diagnosis not present

## 2020-07-06 DIAGNOSIS — I5022 Chronic systolic (congestive) heart failure: Secondary | ICD-10-CM | POA: Diagnosis not present

## 2020-07-06 DIAGNOSIS — M5136 Other intervertebral disc degeneration, lumbar region: Secondary | ICD-10-CM | POA: Diagnosis not present

## 2020-07-06 DIAGNOSIS — E782 Mixed hyperlipidemia: Secondary | ICD-10-CM | POA: Diagnosis not present

## 2020-07-06 DIAGNOSIS — M9902 Segmental and somatic dysfunction of thoracic region: Secondary | ICD-10-CM | POA: Diagnosis not present

## 2020-07-06 DIAGNOSIS — M9904 Segmental and somatic dysfunction of sacral region: Secondary | ICD-10-CM | POA: Diagnosis not present

## 2020-07-06 DIAGNOSIS — E7800 Pure hypercholesterolemia, unspecified: Secondary | ICD-10-CM | POA: Diagnosis not present

## 2020-07-06 DIAGNOSIS — M5135 Other intervertebral disc degeneration, thoracolumbar region: Secondary | ICD-10-CM | POA: Diagnosis not present

## 2020-07-08 DIAGNOSIS — M9903 Segmental and somatic dysfunction of lumbar region: Secondary | ICD-10-CM | POA: Diagnosis not present

## 2020-07-08 DIAGNOSIS — M5137 Other intervertebral disc degeneration, lumbosacral region: Secondary | ICD-10-CM | POA: Diagnosis not present

## 2020-07-08 DIAGNOSIS — M9902 Segmental and somatic dysfunction of thoracic region: Secondary | ICD-10-CM | POA: Diagnosis not present

## 2020-07-08 DIAGNOSIS — M5136 Other intervertebral disc degeneration, lumbar region: Secondary | ICD-10-CM | POA: Diagnosis not present

## 2020-07-08 DIAGNOSIS — M9904 Segmental and somatic dysfunction of sacral region: Secondary | ICD-10-CM | POA: Diagnosis not present

## 2020-07-08 DIAGNOSIS — M5135 Other intervertebral disc degeneration, thoracolumbar region: Secondary | ICD-10-CM | POA: Diagnosis not present

## 2020-07-09 DIAGNOSIS — M9903 Segmental and somatic dysfunction of lumbar region: Secondary | ICD-10-CM | POA: Diagnosis not present

## 2020-07-09 DIAGNOSIS — M9902 Segmental and somatic dysfunction of thoracic region: Secondary | ICD-10-CM | POA: Diagnosis not present

## 2020-07-09 DIAGNOSIS — M5135 Other intervertebral disc degeneration, thoracolumbar region: Secondary | ICD-10-CM | POA: Diagnosis not present

## 2020-07-09 DIAGNOSIS — M5137 Other intervertebral disc degeneration, lumbosacral region: Secondary | ICD-10-CM | POA: Diagnosis not present

## 2020-07-09 DIAGNOSIS — M5136 Other intervertebral disc degeneration, lumbar region: Secondary | ICD-10-CM | POA: Diagnosis not present

## 2020-07-09 DIAGNOSIS — M9904 Segmental and somatic dysfunction of sacral region: Secondary | ICD-10-CM | POA: Diagnosis not present

## 2020-07-12 DIAGNOSIS — M9902 Segmental and somatic dysfunction of thoracic region: Secondary | ICD-10-CM | POA: Diagnosis not present

## 2020-07-12 DIAGNOSIS — M9903 Segmental and somatic dysfunction of lumbar region: Secondary | ICD-10-CM | POA: Diagnosis not present

## 2020-07-12 DIAGNOSIS — M9904 Segmental and somatic dysfunction of sacral region: Secondary | ICD-10-CM | POA: Diagnosis not present

## 2020-07-12 DIAGNOSIS — M5136 Other intervertebral disc degeneration, lumbar region: Secondary | ICD-10-CM | POA: Diagnosis not present

## 2020-07-12 DIAGNOSIS — M5135 Other intervertebral disc degeneration, thoracolumbar region: Secondary | ICD-10-CM | POA: Diagnosis not present

## 2020-07-12 DIAGNOSIS — M5137 Other intervertebral disc degeneration, lumbosacral region: Secondary | ICD-10-CM | POA: Diagnosis not present

## 2020-07-14 DIAGNOSIS — M9902 Segmental and somatic dysfunction of thoracic region: Secondary | ICD-10-CM | POA: Diagnosis not present

## 2020-07-14 DIAGNOSIS — M5137 Other intervertebral disc degeneration, lumbosacral region: Secondary | ICD-10-CM | POA: Diagnosis not present

## 2020-07-14 DIAGNOSIS — M5136 Other intervertebral disc degeneration, lumbar region: Secondary | ICD-10-CM | POA: Diagnosis not present

## 2020-07-14 DIAGNOSIS — M9903 Segmental and somatic dysfunction of lumbar region: Secondary | ICD-10-CM | POA: Diagnosis not present

## 2020-07-14 DIAGNOSIS — M5135 Other intervertebral disc degeneration, thoracolumbar region: Secondary | ICD-10-CM | POA: Diagnosis not present

## 2020-07-14 DIAGNOSIS — M9904 Segmental and somatic dysfunction of sacral region: Secondary | ICD-10-CM | POA: Diagnosis not present

## 2020-07-16 DIAGNOSIS — M5137 Other intervertebral disc degeneration, lumbosacral region: Secondary | ICD-10-CM | POA: Diagnosis not present

## 2020-07-16 DIAGNOSIS — M5136 Other intervertebral disc degeneration, lumbar region: Secondary | ICD-10-CM | POA: Diagnosis not present

## 2020-07-16 DIAGNOSIS — M5135 Other intervertebral disc degeneration, thoracolumbar region: Secondary | ICD-10-CM | POA: Diagnosis not present

## 2020-07-16 DIAGNOSIS — M9904 Segmental and somatic dysfunction of sacral region: Secondary | ICD-10-CM | POA: Diagnosis not present

## 2020-07-16 DIAGNOSIS — M9903 Segmental and somatic dysfunction of lumbar region: Secondary | ICD-10-CM | POA: Diagnosis not present

## 2020-07-16 DIAGNOSIS — M9902 Segmental and somatic dysfunction of thoracic region: Secondary | ICD-10-CM | POA: Diagnosis not present

## 2020-07-19 DIAGNOSIS — M9902 Segmental and somatic dysfunction of thoracic region: Secondary | ICD-10-CM | POA: Diagnosis not present

## 2020-07-19 DIAGNOSIS — M9904 Segmental and somatic dysfunction of sacral region: Secondary | ICD-10-CM | POA: Diagnosis not present

## 2020-07-19 DIAGNOSIS — M5136 Other intervertebral disc degeneration, lumbar region: Secondary | ICD-10-CM | POA: Diagnosis not present

## 2020-07-19 DIAGNOSIS — M5137 Other intervertebral disc degeneration, lumbosacral region: Secondary | ICD-10-CM | POA: Diagnosis not present

## 2020-07-19 DIAGNOSIS — M5135 Other intervertebral disc degeneration, thoracolumbar region: Secondary | ICD-10-CM | POA: Diagnosis not present

## 2020-07-19 DIAGNOSIS — M9903 Segmental and somatic dysfunction of lumbar region: Secondary | ICD-10-CM | POA: Diagnosis not present

## 2020-07-21 DIAGNOSIS — M5136 Other intervertebral disc degeneration, lumbar region: Secondary | ICD-10-CM | POA: Diagnosis not present

## 2020-07-21 DIAGNOSIS — M9902 Segmental and somatic dysfunction of thoracic region: Secondary | ICD-10-CM | POA: Diagnosis not present

## 2020-07-21 DIAGNOSIS — M9904 Segmental and somatic dysfunction of sacral region: Secondary | ICD-10-CM | POA: Diagnosis not present

## 2020-07-21 DIAGNOSIS — M9903 Segmental and somatic dysfunction of lumbar region: Secondary | ICD-10-CM | POA: Diagnosis not present

## 2020-07-21 DIAGNOSIS — M5137 Other intervertebral disc degeneration, lumbosacral region: Secondary | ICD-10-CM | POA: Diagnosis not present

## 2020-07-21 DIAGNOSIS — M5135 Other intervertebral disc degeneration, thoracolumbar region: Secondary | ICD-10-CM | POA: Diagnosis not present

## 2020-07-26 DIAGNOSIS — M9903 Segmental and somatic dysfunction of lumbar region: Secondary | ICD-10-CM | POA: Diagnosis not present

## 2020-07-26 DIAGNOSIS — M9904 Segmental and somatic dysfunction of sacral region: Secondary | ICD-10-CM | POA: Diagnosis not present

## 2020-07-26 DIAGNOSIS — M5137 Other intervertebral disc degeneration, lumbosacral region: Secondary | ICD-10-CM | POA: Diagnosis not present

## 2020-07-26 DIAGNOSIS — M5136 Other intervertebral disc degeneration, lumbar region: Secondary | ICD-10-CM | POA: Diagnosis not present

## 2020-07-26 DIAGNOSIS — M9902 Segmental and somatic dysfunction of thoracic region: Secondary | ICD-10-CM | POA: Diagnosis not present

## 2020-07-26 DIAGNOSIS — M5135 Other intervertebral disc degeneration, thoracolumbar region: Secondary | ICD-10-CM | POA: Diagnosis not present

## 2020-07-28 DIAGNOSIS — M5135 Other intervertebral disc degeneration, thoracolumbar region: Secondary | ICD-10-CM | POA: Diagnosis not present

## 2020-07-28 DIAGNOSIS — M9904 Segmental and somatic dysfunction of sacral region: Secondary | ICD-10-CM | POA: Diagnosis not present

## 2020-07-28 DIAGNOSIS — M9903 Segmental and somatic dysfunction of lumbar region: Secondary | ICD-10-CM | POA: Diagnosis not present

## 2020-07-28 DIAGNOSIS — M9902 Segmental and somatic dysfunction of thoracic region: Secondary | ICD-10-CM | POA: Diagnosis not present

## 2020-07-28 DIAGNOSIS — M5137 Other intervertebral disc degeneration, lumbosacral region: Secondary | ICD-10-CM | POA: Diagnosis not present

## 2020-07-28 DIAGNOSIS — M5136 Other intervertebral disc degeneration, lumbar region: Secondary | ICD-10-CM | POA: Diagnosis not present

## 2020-08-02 DIAGNOSIS — M9902 Segmental and somatic dysfunction of thoracic region: Secondary | ICD-10-CM | POA: Diagnosis not present

## 2020-08-02 DIAGNOSIS — M9904 Segmental and somatic dysfunction of sacral region: Secondary | ICD-10-CM | POA: Diagnosis not present

## 2020-08-02 DIAGNOSIS — M9903 Segmental and somatic dysfunction of lumbar region: Secondary | ICD-10-CM | POA: Diagnosis not present

## 2020-08-02 DIAGNOSIS — M5137 Other intervertebral disc degeneration, lumbosacral region: Secondary | ICD-10-CM | POA: Diagnosis not present

## 2020-08-02 DIAGNOSIS — M5135 Other intervertebral disc degeneration, thoracolumbar region: Secondary | ICD-10-CM | POA: Diagnosis not present

## 2020-08-02 DIAGNOSIS — M5136 Other intervertebral disc degeneration, lumbar region: Secondary | ICD-10-CM | POA: Diagnosis not present

## 2020-08-04 DIAGNOSIS — M5135 Other intervertebral disc degeneration, thoracolumbar region: Secondary | ICD-10-CM | POA: Diagnosis not present

## 2020-08-04 DIAGNOSIS — M5137 Other intervertebral disc degeneration, lumbosacral region: Secondary | ICD-10-CM | POA: Diagnosis not present

## 2020-08-04 DIAGNOSIS — M9903 Segmental and somatic dysfunction of lumbar region: Secondary | ICD-10-CM | POA: Diagnosis not present

## 2020-08-04 DIAGNOSIS — M5136 Other intervertebral disc degeneration, lumbar region: Secondary | ICD-10-CM | POA: Diagnosis not present

## 2020-08-04 DIAGNOSIS — M9902 Segmental and somatic dysfunction of thoracic region: Secondary | ICD-10-CM | POA: Diagnosis not present

## 2020-08-04 DIAGNOSIS — M9904 Segmental and somatic dysfunction of sacral region: Secondary | ICD-10-CM | POA: Diagnosis not present

## 2020-08-12 DIAGNOSIS — E7800 Pure hypercholesterolemia, unspecified: Secondary | ICD-10-CM | POA: Diagnosis not present

## 2020-08-12 DIAGNOSIS — E782 Mixed hyperlipidemia: Secondary | ICD-10-CM | POA: Diagnosis not present

## 2020-08-12 DIAGNOSIS — I5022 Chronic systolic (congestive) heart failure: Secondary | ICD-10-CM | POA: Diagnosis not present

## 2020-09-27 DIAGNOSIS — E782 Mixed hyperlipidemia: Secondary | ICD-10-CM | POA: Diagnosis not present

## 2020-09-27 DIAGNOSIS — I5022 Chronic systolic (congestive) heart failure: Secondary | ICD-10-CM | POA: Diagnosis not present

## 2020-09-27 DIAGNOSIS — M62838 Other muscle spasm: Secondary | ICD-10-CM | POA: Diagnosis not present

## 2020-09-27 DIAGNOSIS — R7309 Other abnormal glucose: Secondary | ICD-10-CM | POA: Diagnosis not present

## 2020-09-27 DIAGNOSIS — Z23 Encounter for immunization: Secondary | ICD-10-CM | POA: Diagnosis not present

## 2020-11-09 DIAGNOSIS — E782 Mixed hyperlipidemia: Secondary | ICD-10-CM | POA: Diagnosis not present

## 2020-11-09 DIAGNOSIS — E7800 Pure hypercholesterolemia, unspecified: Secondary | ICD-10-CM | POA: Diagnosis not present

## 2020-11-09 DIAGNOSIS — I5022 Chronic systolic (congestive) heart failure: Secondary | ICD-10-CM | POA: Diagnosis not present

## 2020-11-21 ENCOUNTER — Ambulatory Visit (INDEPENDENT_AMBULATORY_CARE_PROVIDER_SITE_OTHER): Payer: PPO

## 2020-11-21 ENCOUNTER — Other Ambulatory Visit: Payer: Self-pay

## 2020-11-21 ENCOUNTER — Ambulatory Visit
Admission: EM | Admit: 2020-11-21 | Discharge: 2020-11-21 | Disposition: A | Discharge status: Home / Self Care | Payer: PPO | Attending: Emergency Medicine | Admitting: Emergency Medicine

## 2020-11-21 ENCOUNTER — Encounter: Payer: Self-pay | Admitting: Emergency Medicine

## 2020-11-21 DIAGNOSIS — R0989 Other specified symptoms and signs involving the circulatory and respiratory systems: Secondary | ICD-10-CM

## 2020-11-21 DIAGNOSIS — R059 Cough, unspecified: Secondary | ICD-10-CM | POA: Diagnosis not present

## 2020-11-21 DIAGNOSIS — Z20822 Contact with and (suspected) exposure to covid-19: Secondary | ICD-10-CM | POA: Diagnosis not present

## 2020-11-21 DIAGNOSIS — J069 Acute upper respiratory infection, unspecified: Secondary | ICD-10-CM

## 2020-11-21 MED ORDER — AEROCHAMBER PLUS FLO-VU MEDIUM MISC: 1.0000 | Freq: Once | 0 refills | Status: AC

## 2020-11-21 MED ORDER — BENZONATATE 100 MG PO CAPS: 100.0000 mg | ORAL_CAPSULE | Freq: Three times a day (TID) | ORAL | 0 refills | Status: DC

## 2020-11-21 MED ORDER — ALBUTEROL SULFATE HFA 108 (90 BASE) MCG/ACT IN AERS: 2.0000 | INHALATION_SPRAY | Freq: Four times a day (QID) | RESPIRATORY_TRACT | 2 refills | Status: AC | PRN

## 2020-11-21 NOTE — ED Triage Notes (Signed)
Pt here for cough and increased congestion x 5 days; pt sts some SOB last night and had to sleep sitting up; denies fever

## 2020-11-21 NOTE — ED Provider Notes (Signed)
[facility]    CSN: [id number] Arrival date & time: 11/21/20  1008      History   Chief Complaint Chief Complaint  Patient presents with  . Cough    HPI Jerome Kent is a 69 y.o. male  With extensive history as below presenting for 5-day course of cough, nasal congestion.  Patient did have some dyspnea last night which was relieved by sleeping propped up on pillows.  Denies chest pain, palpitations, lower leg swelling, fever.  States family member was recently sick as well: Unknown Covid.  Requesting testing today: Is fully vaccinated.  Cough is mildly productive without hemoptysis.  Past Medical History:  Diagnosis Date  . Acute systolic heart failure (HCC)   . Atrial flutter (HCC)   . Cardiomyopathy (HCC)   . Chronic systolic heart failure (HCC)    EF 15% on TEE 9/14 during cardioversion  . Dysrhythmia    atrial flutter  . Erectile dysfunction   . Hyperlipidemia   . Obesity   . OSA (obstructive sleep apnea)   . Osteoarthritis   . Shortness of breath   . Sleep apnea     Patient Active Problem List   Diagnosis Date Noted  . Atrial fibrillation (HCC) 09/17/2018  . Hyperlipidemia 09/17/2018  . OSA (obstructive sleep apnea) 09/16/2018  . Unstable angina (HCC) 09/16/2018  . Typical atrial flutter (HCC) 02/09/2017  . Chronic systolic heart failure (HCC)   . Cardiomyopathy (HCC)   . Obesity   . Atrial flutter (HCC) 09/15/2013    Past Surgical History:  Procedure Laterality Date  . A-FLUTTER ABLATION N/A 02/09/2017   Procedure: A-Flutter Ablation;  Surgeon: Will Jorja Loa, MD;  Location: [facility] INVASIVE CV LAB;  Service: Cardiovascular;  Laterality: N/A;  . APPENDECTOMY    . CARDIOVERSION N/A 09/15/2013   Procedure: CARDIOVERSION;  Surgeon: [name], MD;  Location: [facility] ENDOSCOPY;  Service: Cardiovascular;  Laterality: N/A;  . CARDIOVERSION N/A 01/11/2017   Procedure: CARDIOVERSION;  Surgeon: Jake Bathe, MD;  Location: [facility] ENDOSCOPY;  Service:  Cardiovascular;  Laterality: N/A;  . LEFT HEART CATHETERIZATION WITH CORONARY ANGIOGRAM N/A 10/29/2013   Procedure: LEFT HEART CATHETERIZATION WITH CORONARY ANGIOGRAM;  Surgeon: [name], MD;  Location: [facility] CATH LAB;  Service: Cardiovascular;  Laterality: N/A;  . RIGHT/LEFT HEART CATH AND CORONARY ANGIOGRAPHY N/A 09/17/2018   Procedure: RIGHT/LEFT HEART CATH AND CORONARY ANGIOGRAPHY;  Surgeon: Corky Crafts, MD;  Location: [facility] INVASIVE CV LAB;  Service: Cardiovascular;  Laterality: N/A;  . TEE WITHOUT CARDIOVERSION N/A 09/15/2013   Procedure: TRANSESOPHAGEAL ECHOCARDIOGRAM (TEE);  Surgeon: [name], MD;  Location: [facility] ENDOSCOPY;  Service: Cardiovascular;  Laterality: N/A;  . TEE WITHOUT CARDIOVERSION N/A 01/11/2017   Procedure: TRANSESOPHAGEAL ECHOCARDIOGRAM (TEE);  Surgeon: Jake Bathe, MD;  Location: [facility] ENDOSCOPY;  Service: Cardiovascular;  Laterality: N/A;  . TOTAL HIP ARTHROPLASTY Right 2012  . ULTRASOUND GUIDANCE FOR VASCULAR ACCESS  09/17/2018   Procedure: Ultrasound Guidance For Vascular Access;  Surgeon: Corky Crafts, MD;  Location: [facility] INVASIVE CV LAB;  Service: Cardiovascular;;       Home Medications    Prior to Admission medications   Medication Sig Start Date End Date Taking? Authorizing Provider  albuterol (VENTOLIN HFA) 108 (90 Base) MCG/ACT inhaler Inhale 2 puffs into the lungs every 6 (six) hours as needed for wheezing or shortness of breath. 11/21/20   Hall-Potvin, [place], PA-C  atorvastatin (LIPITOR) 40 MG tablet Take 1 tablet (40 mg total) by mouth daily. 11/24/15  Jake Bathe, MD  benzonatate (TESSALON) 100 MG capsule Take 1 capsule (100 mg total) by mouth every 8 (eight) hours. 11/21/20   Hall-Potvin, [place], PA-C  Cyanocobalamin (B-12 PO) Take 1 tablet by mouth daily.    [provider]  furosemide (LASIX) 20 MG tablet TAKE 1 TABLET BY MOUTH EVERY DAY 03/11/20   Jake Bathe, MD  lisinopril (ZESTRIL) 5 MG tablet TAKE 1 TABLET BY MOUTH  EVERY DAY 03/11/20   Jake Bathe, MD  metoprolol succinate (TOPROL-XL) 25 MG 24 hr tablet TAKE 1 TABLET BY MOUTH EVERY DAY 05/25/20   Jake Bathe, MD  PRESCRIPTION MEDICATION CPAP: At bedtime only    [provider]  Spacer/Aero-Holding Chambers (AEROCHAMBER PLUS FLO-VU MEDIUM) MISC 1 each by Other route once for 1 dose. 11/21/20 11/21/20  Hall-Potvin, [place], PA-C    Family History Family History  Problem Relation Age of Onset  . Diabetes Father   . Cancer Father   . Stroke Father   . Diabetes Brother   . Hypertension Mother   . Heart attack Unknown     Social History Social History   Tobacco Use  . Smoking status: Former Smoker    Quit date: 12/25/1994    Years since quitting: 25.9  . Smokeless tobacco: Never Used  Vaping Use  . Vaping Use: Never used  Substance Use Topics  . Alcohol use: Yes    Alcohol/week: 6.0 standard drinks    Types: 6 Cans of beer per week  . Drug use: No     Allergies   Patient has no known allergies.   Review of Systems Review of Systems  Constitutional: Negative for fatigue and fever.  HENT: Positive for congestion. Negative for dental problem, ear pain, facial swelling, hearing loss, sinus pain, sore throat, trouble swallowing and voice change.   Eyes: Negative for photophobia, pain and visual disturbance.  Respiratory: Positive for cough and shortness of breath. Negative for choking and stridor.        Worse at night, no SOB in office  Cardiovascular: Negative for chest pain and palpitations.  Gastrointestinal: Negative for diarrhea and vomiting.  Musculoskeletal: Negative for arthralgias and myalgias.  Neurological: Negative for dizziness and headaches.     Physical Exam Triage Vital Signs ED Triage Vitals [11/21/20 1031]  Enc Vitals Group     BP 132/75     Pulse Rate 74     Resp 18     Temp 98.3 F (36.8 C)     Temp Source Oral     SpO2 92 %     Weight      Height      Head Circumference      Peak Flow       Pain Score      Pain Loc      Pain Edu?      Excl. in GC?    No data found.  Updated Vital Signs BP 132/75 (BP Location: Right Arm)   Pulse 74   Temp 98.3 F (36.8 C) (Oral)   Resp 18   SpO2 93%   Visual Acuity Right Eye Distance:   Left Eye Distance:   Bilateral Distance:    Right Eye Near:   Left Eye Near:    Bilateral Near:     Physical Exam Constitutional:      General: He is not in acute distress. HENT:     Head: Normocephalic and atraumatic.     Right Ear: Tympanic  membrane and ear canal normal.     Left Ear: Tympanic membrane and ear canal normal.     Nose: Nose normal.     Mouth/Throat:     Mouth: Mucous membranes are moist.     Pharynx: Oropharynx is clear. No oropharyngeal exudate or posterior oropharyngeal erythema.  Eyes:     General: No scleral icterus.    Conjunctiva/sclera: Conjunctivae normal.     Pupils: Pupils are equal, round, and reactive to light.  Cardiovascular:     Rate and Rhythm: Normal rate and regular rhythm.  Pulmonary:     Effort: Pulmonary effort is normal. No respiratory distress.     Breath sounds: Wheezing and rales present.     Comments: Mild, scattered with slight improvement with cough. Skin:    Coloration: Skin is not jaundiced or pale.  Neurological:     Mental Status: He is alert and oriented to person, place, and time.      UC Treatments / Results  Labs (all labs ordered are listed, but only abnormal results are displayed) Labs Reviewed  NOVEL CORONAVIRUS, NAA    EKG   Radiology DG Chest 2 View  Result Date: 11/21/2020 CLINICAL DATA:  Cough and congestion EXAM: CHEST - 2 VIEW COMPARISON:  September 16, 2018 FINDINGS: Minimal scarring left base. Lungs otherwise clear. Heart size and pulmonary vascularity are normal. No adenopathy. No bone lesions. IMPRESSION: Minimal scarring left base. No edema or airspace opacity. Cardiac silhouette normal. Electronically Signed   By: Bretta Bang III M.D.   On:  11/21/2020 11:01    Procedures Procedures (including critical care time)  Medications Ordered in UC Medications - No data to display  Initial Impression / Assessment and Plan / UC Course  I have reviewed the triage vital signs and the nursing notes.  Pertinent labs & imaging results that were available during my care of the patient were reviewed by me and considered in my medical decision making (see chart for details).     Patient afebrile, nontoxic, with SpO2 93%.  CXR negative.  Covid PCR pending.  Patient to quarantine until results are back.  We will treat supportively as outlined below.  Return precautions discussed, patient verbalized understanding and is agreeable to plan. Final Clinical Impressions(s) / UC Diagnoses   Final diagnoses:  Encounter for screening laboratory testing for COVID-19 virus  Viral URI with cough     Discharge Instructions     Tessalon for cough. Start flonase, atrovent nasal spray for nasal congestion/drainage. You can use over the counter nasal saline rinse such as neti pot for nasal congestion. Keep hydrated, your urine should be clear to pale yellow in color. Tylenol/motrin for fever and pain. Monitor for any worsening of symptoms, chest pain, shortness of breath, wheezing, swelling of the throat, go to the emergency department for further evaluation needed.     ED Prescriptions    Medication Sig Dispense Auth. Provider   Spacer/Aero-Holding Chambers (AEROCHAMBER PLUS FLO-VU MEDIUM) MISC 1 each by Other route once for 1 dose. 1 each Hall-Potvin, [place], PA-C   albuterol (VENTOLIN HFA) 108 (90 Base) MCG/ACT inhaler Inhale 2 puffs into the lungs every 6 (six) hours as needed for wheezing or shortness of breath. 8 g Hall-Potvin, [place], PA-C   benzonatate (TESSALON) 100 MG capsule Take 1 capsule (100 mg total) by mouth every 8 (eight) hours. 21 capsule Hall-Potvin, [place], PA-C     PDMP not reviewed this encounter.   Hall-Potvin, [place],  [place] 11/21/20 1124

## 2020-11-21 NOTE — Discharge Instructions (Signed)

## 2020-11-22 LAB — NOVEL CORONAVIRUS, NAA: SARS-CoV-2, NAA: NOT DETECTED

## 2020-11-22 LAB — SARS-COV-2, NAA 2 DAY TAT

## 2020-12-21 DIAGNOSIS — E782 Mixed hyperlipidemia: Secondary | ICD-10-CM | POA: Diagnosis not present

## 2020-12-21 DIAGNOSIS — G4733 Obstructive sleep apnea (adult) (pediatric): Secondary | ICD-10-CM | POA: Diagnosis not present

## 2020-12-21 DIAGNOSIS — Z0000 Encounter for general adult medical examination without abnormal findings: Secondary | ICD-10-CM | POA: Diagnosis not present

## 2020-12-21 DIAGNOSIS — I429 Cardiomyopathy, unspecified: Secondary | ICD-10-CM | POA: Diagnosis not present

## 2020-12-21 DIAGNOSIS — R7309 Other abnormal glucose: Secondary | ICD-10-CM | POA: Diagnosis not present

## 2020-12-21 DIAGNOSIS — Z1389 Encounter for screening for other disorder: Secondary | ICD-10-CM | POA: Diagnosis not present

## 2020-12-21 DIAGNOSIS — Z125 Encounter for screening for malignant neoplasm of prostate: Secondary | ICD-10-CM | POA: Diagnosis not present

## 2020-12-31 ENCOUNTER — Encounter: Payer: Self-pay | Admitting: Emergency Medicine

## 2020-12-31 ENCOUNTER — Ambulatory Visit
Admission: EM | Admit: 2020-12-31 | Discharge: 2020-12-31 | Disposition: A | Discharge status: Home / Self Care | Payer: PPO | Attending: Emergency Medicine | Admitting: Emergency Medicine

## 2020-12-31 ENCOUNTER — Other Ambulatory Visit: Payer: Self-pay

## 2020-12-31 DIAGNOSIS — J069 Acute upper respiratory infection, unspecified: Secondary | ICD-10-CM

## 2020-12-31 MED ORDER — BENZONATATE 200 MG PO CAPS: 200.0000 mg | ORAL_CAPSULE | Freq: Three times a day (TID) | ORAL | 0 refills | Status: AC | PRN

## 2020-12-31 NOTE — ED Triage Notes (Signed)
Patient c/o nonproductive cough, generalized body aches, chills, and headache x 1 day.   Patient denies fever at home.  Patient has used Mucinex w/ no relief of symptoms.

## 2020-12-31 NOTE — ED Provider Notes (Signed)
[facility]    CSN: [id number] Arrival date & time: 12/31/20  1730      History   Chief Complaint Chief Complaint  Patient presents with  . Cough  . Generalized Body Aches  . Chills    HPI Jerome Kent is a 70 y.o. male presenting today for evaluation of URI symptoms.  Reports cough headache fatigue chills that began last night.  He denies any known sick contacts or close exposures to Covid.  Denies any fevers.  Reports Mucinex with some relief.  HPI  Past Medical History:  Diagnosis Date  . Acute systolic heart failure (HCC)   . Atrial flutter (HCC)   . Cardiomyopathy (HCC)   . Chronic systolic heart failure (HCC)    EF 15% on TEE 9/14 during cardioversion  . Dysrhythmia    atrial flutter  . Erectile dysfunction   . Hyperlipidemia   . Obesity   . OSA (obstructive sleep apnea)   . Osteoarthritis   . Shortness of breath   . Sleep apnea     Patient Active Problem List   Diagnosis Date Noted  . Atrial fibrillation (HCC) 09/17/2018  . Hyperlipidemia 09/17/2018  . OSA (obstructive sleep apnea) 09/16/2018  . Unstable angina (HCC) 09/16/2018  . Typical atrial flutter (HCC) 02/09/2017  . Chronic systolic heart failure (HCC)   . Cardiomyopathy (HCC)   . Obesity   . Atrial flutter (HCC) 09/15/2013    Past Surgical History:  Procedure Laterality Date  . A-FLUTTER ABLATION N/A 02/09/2017   Procedure: A-Flutter Ablation;  Surgeon: Will Jorja Loa, MD;  [facility]: [facility] INVASIVE CV LAB;  Service: Cardiovascular;  Laterality: N/A;  . APPENDECTOMY    . CARDIOVERSION N/A 09/15/2013   Procedure: CARDIOVERSION;  Surgeon: [name], MD;  Location: [facility] ENDOSCOPY;  Service: Cardiovascular;  Laterality: N/A;  . CARDIOVERSION N/A 01/11/2017   Procedure: CARDIOVERSION;  Surgeon: Jake Bathe, MD;  Location: [facility] ENDOSCOPY;  Service: Cardiovascular;  Laterality: N/A;  . LEFT HEART CATHETERIZATION WITH CORONARY ANGIOGRAM N/A 10/29/2013   Procedure: LEFT HEART  CATHETERIZATION WITH CORONARY ANGIOGRAM;  Surgeon: [name], MD;  Location: [facility] CATH LAB;  Service: Cardiovascular;  Laterality: N/A;  . RIGHT/LEFT HEART CATH AND CORONARY ANGIOGRAPHY N/A 09/17/2018   Procedure: RIGHT/LEFT HEART CATH AND CORONARY ANGIOGRAPHY;  Surgeon: Corky Crafts, MD;  Location: [facility] INVASIVE CV LAB;  Service: Cardiovascular;  Laterality: N/A;  . TEE WITHOUT CARDIOVERSION N/A 09/15/2013   Procedure: TRANSESOPHAGEAL ECHOCARDIOGRAM (TEE);  Surgeon: [name], MD;  Location: [facility] ENDOSCOPY;  Service: Cardiovascular;  Laterality: N/A;  . TEE WITHOUT CARDIOVERSION N/A 01/11/2017   Procedure: TRANSESOPHAGEAL ECHOCARDIOGRAM (TEE);  Surgeon: Jake Bathe, MD;  Location: [facility] ENDOSCOPY;  Service: Cardiovascular;  Laterality: N/A;  . TOTAL HIP ARTHROPLASTY Right 2012  . ULTRASOUND GUIDANCE FOR VASCULAR ACCESS  09/17/2018   Procedure: Ultrasound Guidance For Vascular Access;  Surgeon: Corky Crafts, MD;  Location: [facility] INVASIVE CV LAB;  Service: Cardiovascular;;       Home Medications    Prior to Admission medications   Medication Sig Start Date End Date Taking? Authorizing Provider  benzonatate (TESSALON) 200 MG capsule Take 1 capsule (200 mg total) by mouth 3 (three) times daily as needed for up to 7 days for cough. 12/31/20 01/07/21 Yes Jeriyah Granlund C, PA-C  albuterol (VENTOLIN HFA) 108 (90 Base) MCG/ACT inhaler Inhale 2 puffs into the lungs every 6 (six) hours as needed for wheezing or shortness of breath. 11/21/20   Hall-Potvin, [place], PA-C  atorvastatin (LIPITOR) 40 MG tablet Take 1 tablet (40 mg total) by mouth daily. 11/24/15   Jake Bathe, MD  Cyanocobalamin (B-12 PO) Take 1 tablet by mouth daily.    [provider]  furosemide (LASIX) 20 MG tablet TAKE 1 TABLET BY MOUTH EVERY DAY 03/11/20   Jake Bathe, MD  lisinopril (ZESTRIL) 5 MG tablet TAKE 1 TABLET BY MOUTH EVERY DAY 03/11/20   Jake Bathe, MD  metoprolol succinate (TOPROL-XL) 25 MG 24 hr  tablet TAKE 1 TABLET BY MOUTH EVERY DAY 05/25/20   Jake Bathe, MD  PRESCRIPTION MEDICATION CPAP: At bedtime only    [provider]    Family History Family History  Problem Relation Age of Onset  . Diabetes Father   . Cancer Father   . Stroke Father   . Diabetes Brother   . Hypertension Mother   . Heart attack Other     Social History Social History   Tobacco Use  . Smoking status: Former Smoker    Quit date: 12/25/1994    Years since quitting: 26.0  . Smokeless tobacco: Never Used  Vaping Use  . Vaping Use: Never used  Substance Use Topics  . Alcohol use: Yes    Alcohol/week: 6.0 standard drinks    Types: 6 Cans of beer per week  . Drug use: No     Allergies   Patient has no known allergies.   Review of Systems Review of Systems  Constitutional: Positive for chills and fatigue. Negative for activity change, appetite change and fever.  HENT: Positive for congestion and rhinorrhea. Negative for ear pain, sinus pressure, sore throat and trouble swallowing.   Eyes: Negative for discharge and redness.  Respiratory: Positive for cough. Negative for chest tightness and shortness of breath.   Cardiovascular: Negative for chest pain.  Gastrointestinal: Negative for abdominal pain, diarrhea, nausea and vomiting.  Musculoskeletal: Negative for myalgias.  Skin: Negative for rash.  Neurological: Positive for headaches. Negative for dizziness and light-headedness.     Physical Exam Triage Vital Signs ED Triage Vitals  Enc Vitals Group     BP 12/31/20 1946 132/84     Pulse Rate 12/31/20 1946 79     Resp 12/31/20 1946 19     Temp 12/31/20 1946 98.6 F (37 C)     Temp Source 12/31/20 1946 Oral     SpO2 12/31/20 1946 95 %     Weight 12/31/20 1944 297 lb (134.7 kg)     Height 12/31/20 1944 5\' 9"  (1.753 m)     Head Circumference --      Peak Flow --      Pain Score 12/31/20 1944 3     Pain Loc --      Pain Edu? --      Excl. in GC? --    No data  found.  Updated Vital Signs BP 132/84 (BP Location: Right Arm)   Pulse 79   Temp 98.6 F (37 C) (Oral)   Resp 19   Ht 5\' 9"  (1.753 m)   Wt 297 lb (134.7 kg)   SpO2 95%   BMI 43.86 kg/m   Visual Acuity Right Eye Distance:   Left Eye Distance:   Bilateral Distance:    Right Eye Near:   Left Eye Near:    Bilateral Near:     Physical Exam Vitals and nursing note reviewed.  Constitutional:      Appearance: He is well-developed and well-nourished.  Comments: No acute distress  HENT:     Head: Normocephalic and atraumatic.     Ears:     Comments: Bilateral ears without tenderness to palpation of external auricle, tragus and mastoid, EAC's without erythema or swelling, TM's with good bony landmarks and cone of light. Non erythematous.     Nose: Nose normal.     Mouth/Throat:     Comments: Oral mucosa pink and moist, no tonsillar enlargement or exudate. Posterior pharynx patent and nonerythematous, no uvula deviation or swelling. Normal phonation. Eyes:     Conjunctiva/sclera: Conjunctivae normal.  Cardiovascular:     Rate and Rhythm: Normal rate.  Pulmonary:     Effort: Pulmonary effort is normal. No respiratory distress.     Comments: Breathing comfortably at rest, CTABL, no wheezing, rales or other adventitious sounds auscultated Abdominal:     General: There is no distension.  Musculoskeletal:        General: Normal range of motion.     Cervical back: Neck supple.  Skin:    General: Skin is warm and dry.  Neurological:     Mental Status: He is alert and oriented to person, place, and time.  Psychiatric:        Mood and Affect: Mood and affect normal.      UC Treatments / Results  Labs (all labs ordered are listed, but only abnormal results are displayed) Labs Reviewed  NOVEL CORONAVIRUS, NAA    EKG   Radiology No results found.  Procedures Procedures (including critical care time)  Medications Ordered in UC Medications - No data to  display  Initial Impression / Assessment and Plan / UC Course  I have reviewed the triage vital signs and the nursing notes.  Pertinent labs & imaging results that were available during my care of the patient were reviewed by me and considered in my medical decision making (see chart for details).     Viral URI with cough-Covid test pending, recommending symptomatic and supportive care, exam reassuring today.  Continue to monitor.  Discussed strict return precautions. Patient verbalized understanding and is agreeable with plan.  Final Clinical Impressions(s) / UC Diagnoses   Final diagnoses:  Viral URI with cough     Discharge Instructions     COVID test pending Continue mucinex Add in tessalon/benzonatate for cough Rest and fluids Follow up as needed    ED Prescriptions    Medication Sig Dispense Auth. Provider   benzonatate (TESSALON) 200 MG capsule Take 1 capsule (200 mg total) by mouth 3 (three) times daily as needed for up to 7 days for cough. 28 capsule Fredricka Kohrs, [place] C, PA-C     PDMP not reviewed this encounter.   Janith Lima, [place] 01/01/21 [phone]

## 2020-12-31 NOTE — Discharge Instructions (Signed)
COVID test pending Continue mucinex Add in tessalon/benzonatate for cough Rest and fluids Follow up as needed

## 2021-01-04 LAB — NOVEL CORONAVIRUS, NAA: SARS-CoV-2, NAA: DETECTED — AB

## 2021-01-05 DIAGNOSIS — Z1152 Encounter for screening for COVID-19: Secondary | ICD-10-CM | POA: Diagnosis not present

## 2021-01-14 DIAGNOSIS — I5022 Chronic systolic (congestive) heart failure: Secondary | ICD-10-CM | POA: Diagnosis not present

## 2021-01-14 DIAGNOSIS — E782 Mixed hyperlipidemia: Secondary | ICD-10-CM | POA: Diagnosis not present

## 2021-01-14 DIAGNOSIS — E7800 Pure hypercholesterolemia, unspecified: Secondary | ICD-10-CM | POA: Diagnosis not present

## 2021-01-31 ENCOUNTER — Other Ambulatory Visit: Payer: Self-pay | Admitting: Cardiology

## 2021-01-31 DIAGNOSIS — I5022 Chronic systolic (congestive) heart failure: Secondary | ICD-10-CM

## 2021-02-02 ENCOUNTER — Telehealth: Payer: Self-pay | Admitting: Cardiology

## 2021-02-02 DIAGNOSIS — I5022 Chronic systolic (congestive) heart failure: Secondary | ICD-10-CM

## 2021-02-02 DIAGNOSIS — E7800 Pure hypercholesterolemia, unspecified: Secondary | ICD-10-CM | POA: Diagnosis not present

## 2021-02-02 DIAGNOSIS — E782 Mixed hyperlipidemia: Secondary | ICD-10-CM | POA: Diagnosis not present

## 2021-02-02 MED ORDER — LISINOPRIL 5 MG PO TABS: 5.0000 mg | ORAL_TABLET | Freq: Every day | ORAL | 0 refills | Status: DC

## 2021-02-02 MED ORDER — FUROSEMIDE 20 MG PO TABS: 20.0000 mg | ORAL_TABLET | Freq: Every day | ORAL | 0 refills | Status: DC

## 2021-02-02 NOTE — Telephone Encounter (Signed)
Per Colin Mulders with [facility]  *STAT* If patient is at the pharmacy, call can be transferred to refill team.   1. Which medications need to be refilled? (please list name of each medication and dose if known)  furosemide (LASIX) 20 MG tablet lisinopril (ZESTRIL) 5 MG tablet  2. Which pharmacy/location (including street and city if local pharmacy) is medication to be sent to? [facility] - [place], [place] - [place] [id number]  3. Do they need a 30 day or 90 day supply? 90 day supply

## 2021-02-02 NOTE — Telephone Encounter (Signed)
Pt's medications were sent to pt's pharmacy as requested. Confirmation received.  

## 2021-02-09 ENCOUNTER — Other Ambulatory Visit: Payer: Self-pay | Admitting: Cardiology

## 2021-02-09 DIAGNOSIS — I5022 Chronic systolic (congestive) heart failure: Secondary | ICD-10-CM

## 2021-02-09 MED ORDER — LISINOPRIL 5 MG PO TABS: 5.0000 mg | ORAL_TABLET | Freq: Every day | ORAL | 0 refills | Status: DC

## 2021-02-09 MED ORDER — FUROSEMIDE 20 MG PO TABS: 20.0000 mg | ORAL_TABLET | Freq: Every day | ORAL | 0 refills | Status: DC

## 2021-02-09 NOTE — Addendum Note (Signed)
Addended by: Margaret Pyle D on: 02/09/2021 02:32 PM   Modules accepted: Orders

## 2021-02-09 NOTE — Telephone Encounter (Signed)
Pt's medication was resent to [facility] as requested. Confirmation received.

## 2021-02-09 NOTE — Telephone Encounter (Signed)
Colin Mulders with [facility] is calling to follow up regarding her refill request. The medication was sent to the incorrect pharmacy.   Please send medication to the pharmacy listed below.  I also made Brianna aware that the patient is due for a follow-up appointment in March. She plans to call the patient and have him contact our office to schedule.   *STAT* If patient is at the pharmacy, call can be transferred to refill team.   1. Which medications need to be refilled? (please list name of each medication and dose if known)  furosemide (LASIX) 20 MG tablet lisinopril (ZESTRIL) 5 MG tablet  2. Which pharmacy/location (including street and city if local pharmacy) is medication to be sent to? [facility] - [place], [place] - [place] [id number]  3. Do they need a 30 day or 90 day supply? 90 day supply

## 2021-02-23 DIAGNOSIS — E7800 Pure hypercholesterolemia, unspecified: Secondary | ICD-10-CM | POA: Diagnosis not present

## 2021-02-23 DIAGNOSIS — E782 Mixed hyperlipidemia: Secondary | ICD-10-CM | POA: Diagnosis not present

## 2021-02-23 DIAGNOSIS — I5022 Chronic systolic (congestive) heart failure: Secondary | ICD-10-CM | POA: Diagnosis not present

## 2021-03-15 DIAGNOSIS — G4733 Obstructive sleep apnea (adult) (pediatric): Secondary | ICD-10-CM | POA: Diagnosis not present

## 2021-03-15 DIAGNOSIS — J343 Hypertrophy of nasal turbinates: Secondary | ICD-10-CM | POA: Diagnosis not present

## 2021-03-15 DIAGNOSIS — Z6841 Body Mass Index (BMI) 40.0 and over, adult: Secondary | ICD-10-CM | POA: Diagnosis not present

## 2021-03-15 DIAGNOSIS — Z9989 Dependence on other enabling machines and devices: Secondary | ICD-10-CM | POA: Diagnosis not present

## 2021-04-01 ENCOUNTER — Ambulatory Visit: Payer: PPO | Admitting: Cardiology

## 2021-04-01 ENCOUNTER — Other Ambulatory Visit: Payer: Self-pay

## 2021-04-01 ENCOUNTER — Encounter: Payer: Self-pay | Admitting: Cardiology

## 2021-04-01 VITALS — BP 124/66 | HR 53 | Ht 69.0 in | Wt 304.0 lb

## 2021-04-01 DIAGNOSIS — I5022 Chronic systolic (congestive) heart failure: Secondary | ICD-10-CM

## 2021-04-01 DIAGNOSIS — I480 Paroxysmal atrial fibrillation: Secondary | ICD-10-CM

## 2021-04-01 NOTE — Progress Notes (Signed)
Cardiology Office Note:    Date:  04/01/2021   ID:  Rik Wadel, DOB 20-Aug-1951, MRN [id number]  PCP:  Merri Brunette, MD  Cardiologist:  [name], MD  Electrophysiologist:  None   Referring MD: Merri Brunette, MD     History of Present Illness:    Jerome Kent is a 70 y.o. male here for follow-up dilated cardiomyopathy, chronic systolic heart failure.  EF had normalized to 65%.  Had atrial flutter ablation in the past.  Brief episode of atrial flutter was noted during cardiac catheterization in 2019.  Overall been doing well.  No chest pain fevers chills nausea vomiting syncope bleeding.  He has gained some extra weight during Covid.  Heaviest he is ever been.  Encouraged weight loss.  Jan 2022 COVID, cough etc.  Feels much better now.  No changes in symptoms currently.  Past Medical History:  Diagnosis Date  . Acute systolic heart failure (HCC)   . Atrial flutter (HCC)   . Cardiomyopathy (HCC)   . Chronic systolic heart failure (HCC)    EF 15% on TEE 9/14 during cardioversion  . Dysrhythmia    atrial flutter  . Erectile dysfunction   . Hyperlipidemia   . Obesity   . OSA (obstructive sleep apnea)   . Osteoarthritis   . Shortness of breath   . Sleep apnea     Past Surgical History:  Procedure Laterality Date  . A-FLUTTER ABLATION N/A 02/09/2017   Procedure: A-Flutter Ablation;  Surgeon: Will Jorja Loa, MD;  Location: [facility] INVASIVE CV LAB;  Service: Cardiovascular;  Laterality: N/A;  . APPENDECTOMY    . CARDIOVERSION N/A 09/15/2013   Procedure: CARDIOVERSION;  Surgeon: [name], MD;  Location: [facility] ENDOSCOPY;  Service: Cardiovascular;  Laterality: N/A;  . CARDIOVERSION N/A 01/11/2017   Procedure: CARDIOVERSION;  Surgeon: Jake Bathe, MD;  Location: [facility] ENDOSCOPY;  Service: Cardiovascular;  Laterality: N/A;  . LEFT HEART CATHETERIZATION WITH CORONARY ANGIOGRAM N/A 10/29/2013   Procedure: LEFT HEART CATHETERIZATION WITH CORONARY ANGIOGRAM;  Surgeon: [name], MD;  Location: [facility] CATH LAB;  Service: Cardiovascular;  Laterality: N/A;  . RIGHT/LEFT HEART CATH AND CORONARY ANGIOGRAPHY N/A 09/17/2018   Procedure: RIGHT/LEFT HEART CATH AND CORONARY ANGIOGRAPHY;  Surgeon: Corky Crafts, MD;  Location: [facility] INVASIVE CV LAB;  Service: Cardiovascular;  Laterality: N/A;  . TEE WITHOUT CARDIOVERSION N/A 09/15/2013   Procedure: TRANSESOPHAGEAL ECHOCARDIOGRAM (TEE);  Surgeon: [name], MD;  Location: [facility] ENDOSCOPY;  Service: Cardiovascular;  Laterality: N/A;  . TEE WITHOUT CARDIOVERSION N/A 01/11/2017   Procedure: TRANSESOPHAGEAL ECHOCARDIOGRAM (TEE);  Surgeon: Jake Bathe, MD;  Location[facility] ENDOSCOPY;  Service: Cardiovascular;  Laterality: N/A;  . TOTAL HIP ARTHROPLASTY Right 2012  . ULTRASOUND GUIDANCE FOR VASCULAR ACCESS  09/17/2018   Procedure: Ultrasound Guidance For Vascular Access;  Surgeon: Corky Crafts, MD;  Location: [facility] INVASIVE CV LAB;  Service: Cardiovascular;;    Current Medications: Current Meds  Medication Sig  . albuterol (VENTOLIN HFA) 108 (90 Base) MCG/ACT inhaler Inhale 2 puffs into the lungs every 6 (six) hours as needed for wheezing or shortness of breath.  [name] Kitchen atorvastatin (LIPITOR) 40 MG tablet Take 1 tablet (40 mg total) by mouth daily.  . Cyanocobalamin (B-12 PO) Take 1 tablet by mouth daily.  . furosemide (LASIX) 20 MG tablet Take 1 tablet (20 mg total) by mouth daily. Please make yearly appt with Dr. Anne Fu for March 2022 before anymore refills. Thank you 1st attempt  . lisinopril (ZESTRIL) 5 MG tablet  Take 1 tablet (5 mg total) by mouth daily. Please make yearly appt with Dr. Anne Fu for March 2022 before anymore refills. Thank you 1st attempt  . metoprolol succinate (TOPROL-XL) 25 MG 24 hr tablet TAKE 1 TABLET BY MOUTH EVERY DAY  . PRESCRIPTION MEDICATION CPAP: At bedtime only     Allergies:   Patient has no known allergies.   Social History   Socioeconomic History  . Marital status: Significant Other    Spouse  name: Not on file  . Number of children: Not on file  . Years of education: Not on file  . Highest education level: Not on file  Occupational History  . Not on file  Tobacco Use  . Smoking status: Former Smoker    Quit date: 12/25/1994    Years since quitting: 26.2  . Smokeless tobacco: Never Used  Vaping Use  . Vaping Use: Never used  Substance and Sexual Activity  . Alcohol use: Yes    Alcohol/week: 6.0 standard drinks    Types: 6 Cans of beer per week  . Drug use: No  . Sexual activity: Yes  Other Topics Concern  . Not on file  Social History Narrative  . Not on file   Social Determinants of Health   Financial Resource Strain: Not on file  Food Insecurity: Not on file  Transportation Needs: Not on file  Physical Activity: Not on file  Stress: Not on file  Social Connections: Not on file     Family History: The patient's family history includes Cancer in his father; Diabetes in his brother and father; Heart attack in an other family member; Hypertension in his mother; Stroke in his father.  ROS:   Please see the history of present illness.     All other systems reviewed and are negative.  EKGs/Labs/Other Studies Reviewed:    The following studies were reviewed today:  Cath 09/17/18:  LV end diastolic pressure is normal. LVEDP 13 mm Hg.  There is no aortic valve stenosis.  No angiographically apparent coronary artery disease.  Normal right heart pressures. PA pressure 27/16, mean PA pressure 20 mm Hg; PCWP 19/15, mean 14 mm Hg; Ao sat 96%, PA sat 70%; CO 6.3 L/min; CI 2.6  The left ventricular ejection fraction is 55-65% by visual estimate.  The left ventricular systolic function is normal.  Right radial loop noted. Patient did tolerate straightening of the loop. Consider alternate access site if cath needed in the future.   EKG:  EKG is  ordered today.  The ekg ordered today demonstrates sinus bradycardia 53 left anterior fascicular block no significant  change from prior.  Sinus rhythm 65 interventricular conduction delay, left axis deviation.  Recent Labs: No results found for requested labs within last 8760 hours.  Recent Lipid Panel    Component Value Date/Time   CHOL 165 09/17/2018 0550   TRIG 139 09/17/2018 0550   HDL 41 09/17/2018 0550   CHOLHDL 4.0 09/17/2018 0550   VLDL 28 09/17/2018 0550   LDLCALC 96 09/17/2018 0550    Physical Exam:    VS:  BP 124/66 (BP Location: Left Arm, Patient Position: Sitting, Cuff Size: Normal)   Pulse (!) 53   Ht 5\' 9"  (1.753 m)   Wt (!) 304 lb (137.9 kg)   SpO2 96%   BMI 44.89 kg/m     Wt Readings from Last 3 Encounters:  04/01/21 (!) 304 lb (137.9 kg)  12/31/20 297 lb (134.7 kg)  03/19/20 (!) 306 lb (  138.8 kg)     GEN: Well nourished, well developed, in no acute distress  HEENT: normal  Neck: no JVD, carotid bruits, or masses Cardiac: RRR; no murmurs, rubs, or gallops,no edema  Respiratory:  clear to auscultation bilaterally, normal work of breathing GI: soft, nontender, nondistended, + BS MS: no deformity or atrophy  Skin: warm and dry, no rash Neuro:  Alert and Oriented x 3, Strength and sensation are intact Psych: euthymic mood, full affect   ASSESSMENT:    1. Chronic systolic heart failure (HCC)   2. Paroxysmal atrial fibrillation (HCC)    PLAN:    In order of problems listed above:  Chronic systolic heart failure-EF improved -Excellent.  NYHA class I.  EF from 35-65.  Continue with low-dose medication.  Relative hypotension in the past. 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  No CAD on catheterization in 2019. -No changes continue with current medications.  Obstructive sleep apnea -CPAP.  Good compliance.  Excellent.  No changes  Morbid obesity -Continue to encourage weight loss.  BMI 46.  Challenging for him.  Asked him to try the [place] map  Atrial flutter/atrial fibrillation paroxysmal -Maintain sinus rhythm for several months post  catheterization.  Post ablation as well for atrial flutter by Dr. Elberta Fortis.  No anticoagulation at this time.  Obviously if this were to return, we need to reinitiate anticoagulation.  Thankfully normal rhythm currently.  Sinus bradycardia 53 on ECG.  Hyperlipidemia -LDL 119 hemoglobin A1c 6   Medication Adjustments/Labs and Tests Ordered: Current medicines are reviewed at length with the patient today.  Concerns regarding medicines are outlined above.  No orders of the defined types were placed in this encounter.  No orders of the defined types were placed in this encounter.   Patient Instructions  Medication Instructions:  The current medical regimen is effective;  continue present plan and medications.  *If you need a refill on your cardiac medications before your next appointment, please call your pharmacy*  Follow-Up: At [facility], you and your health needs are our priority.  As part of our continuing mission to provide you with exceptional heart care, we have created designated Provider Care Teams.  These Care Teams include your primary Cardiologist (physician) and Advanced Practice Providers (APPs -  Physician Assistants and Nurse Practitioners) who all work together to provide you with the care you need, when you need it.  We recommend signing up for the patient portal called "MyChart".  Sign up information is provided on this After Visit Summary.  MyChart is used to connect with patients for Virtual Visits (Telemedicine).  Patients are able to view lab/test results, encounter notes, upcoming appointments, etc.  Non-urgent messages can be sent to your provider as well.   To learn more about what you can do with MyChart, go to [email or web address].    Your next appointment:   12 month(s)  The format for your next appointment:   In Person  Provider:   [name], MD   Thank you for choosing [facility]!!        Signed, [name], MD  04/01/2021  10:24 AM    [facility]

## 2021-04-01 NOTE — Patient Instructions (Signed)
Medication Instructions:  The current medical regimen is effective;  continue present plan and medications.  *If you need a refill on your cardiac medications before your next appointment, please call your pharmacy*  Follow-Up: At [facility], you and your health needs are our priority.  As part of our continuing mission to provide you with exceptional heart care, we have created designated Provider Care Teams.  These Care Teams include your primary Cardiologist (physician) and Advanced Practice Providers (APPs -  Physician Assistants and Nurse Practitioners) who all work together to provide you with the care you need, when you need it.  We recommend signing up for the patient portal called "MyChart".  Sign up information is provided on this After Visit Summary.  MyChart is used to connect with patients for Virtual Visits (Telemedicine).  Patients are able to view lab/test results, encounter notes, upcoming appointments, etc.  Non-urgent messages can be sent to your provider as well.   To learn more about what you can do with MyChart, go to [email or web address].    Your next appointment:   12 month(s)  The format for your next appointment:   In Person  Provider:   Mark Skains, MD   Thank you for choosing [facility]!!      

## 2021-04-13 ENCOUNTER — Encounter (HOSPITAL_COMMUNITY): Payer: Self-pay

## 2021-04-13 ENCOUNTER — Emergency Department (HOSPITAL_COMMUNITY)
Admission: EM | Admit: 2021-04-13 | Discharge: 2021-04-14 | Disposition: A | Discharge status: Home / Self Care | Payer: PPO | Attending: Emergency Medicine | Admitting: Emergency Medicine

## 2021-04-13 ENCOUNTER — Other Ambulatory Visit: Payer: Self-pay

## 2021-04-13 ENCOUNTER — Emergency Department (HOSPITAL_COMMUNITY): Payer: PPO

## 2021-04-13 DIAGNOSIS — Z96641 Presence of right artificial hip joint: Secondary | ICD-10-CM | POA: Insufficient documentation

## 2021-04-13 DIAGNOSIS — I5023 Acute on chronic systolic (congestive) heart failure: Secondary | ICD-10-CM | POA: Insufficient documentation

## 2021-04-13 DIAGNOSIS — Z87891 Personal history of nicotine dependence: Secondary | ICD-10-CM | POA: Insufficient documentation

## 2021-04-13 DIAGNOSIS — I110 Hypertensive heart disease with heart failure: Secondary | ICD-10-CM | POA: Insufficient documentation

## 2021-04-13 DIAGNOSIS — S299XXA Unspecified injury of thorax, initial encounter: Secondary | ICD-10-CM | POA: Diagnosis present

## 2021-04-13 DIAGNOSIS — S2241XA Multiple fractures of ribs, right side, initial encounter for closed fracture: Secondary | ICD-10-CM | POA: Diagnosis not present

## 2021-04-13 DIAGNOSIS — R609 Edema, unspecified: Secondary | ICD-10-CM | POA: Diagnosis not present

## 2021-04-13 DIAGNOSIS — S2231XA Fracture of one rib, right side, initial encounter for closed fracture: Secondary | ICD-10-CM

## 2021-04-13 DIAGNOSIS — R600 Localized edema: Secondary | ICD-10-CM | POA: Diagnosis not present

## 2021-04-13 DIAGNOSIS — Z79899 Other long term (current) drug therapy: Secondary | ICD-10-CM | POA: Insufficient documentation

## 2021-04-13 DIAGNOSIS — W228XXA Striking against or struck by other objects, initial encounter: Secondary | ICD-10-CM | POA: Diagnosis not present

## 2021-04-13 DIAGNOSIS — R0781 Pleurodynia: Secondary | ICD-10-CM

## 2021-04-13 DIAGNOSIS — I10 Essential (primary) hypertension: Secondary | ICD-10-CM | POA: Diagnosis not present

## 2021-04-13 DIAGNOSIS — J9811 Atelectasis: Secondary | ICD-10-CM | POA: Diagnosis not present

## 2021-04-13 NOTE — ED Triage Notes (Signed)
Right rib pain after bending over 4 days ago. Denies any chest pain, dizziness.

## 2021-04-13 NOTE — ED Triage Notes (Signed)
Emergency Medicine Provider Triage Evaluation Note  Jerome Kent , a 70 y.o. male  was evaluated in triage.  Pt complains of right sided rib pain after bending to the right side and hitting his side on the arm of a chair. Pain worse with movement, palpation, & deep breathing. No other areas of injury.   Review of Systems  Positive: Right sided rib pain Negative: Hemoptysis, hematuria, or hematochezia  Physical Exam  BP (!) 149/89 (BP Location: Left Arm)   Pulse (!) 55   Temp 98.1 F (36.7 C) (Oral)   Resp 17   SpO2 96%  Gen:   Awake, no distress   HEENT:  Atraumatic  Resp:  Normal effort, breath sounds present Cardiac:  Bradycardia Abd:   Nondistended, nontender  MSK:   Right inferior posterolateral chest wall tenderness to palpation.  Neuro:  Speech clear   Medical Decision Making  Medically screening exam initiated at 10:56 PM.  Appropriate orders placed.  Jerome Kent was informed that the remainder of the evaluation will be completed by another provider, this initial triage assessment does not replace that evaluation, and the importance of remaining in the ED until their evaluation is complete.  Clinical Impression  Rib pain.    Cherly Anderson, [place] 04/13/21 2309

## 2021-04-14 MED ORDER — OXYCODONE-ACETAMINOPHEN 5-325 MG PO TABS
1.0000 | ORAL_TABLET | Freq: Once | ORAL | Status: AC
Start: 2021-04-14 — End: 2021-04-14
  Administered 2021-04-14: 1 via ORAL
  Filled 2021-04-14: qty 1

## 2021-04-14 MED ORDER — OXYCODONE HCL 5 MG PO TABS: 5.0000 mg | ORAL_TABLET | ORAL | 0 refills | Status: DC | PRN

## 2021-04-14 MED ORDER — OXYCODONE HCL 5 MG PO TABS: 5.0000 mg | ORAL_TABLET | ORAL | 0 refills | Status: AC | PRN

## 2021-04-14 NOTE — Discharge Instructions (Signed)
Apply ice for 30 minutes at a time, 4 times a day.  Take ibuprofen or naproxen as needed for pain.  Add acetaminophen for additional pain relief.  Reserve oxycodone for pain not relieved with above-noted medication, or to use at bedtime.

## 2021-04-14 NOTE — ED Provider Notes (Signed)
[facility] EMERGENCY DEPARTMENT Provider Note   CSN: [id number] Arrival date & time: 04/13/21  2219   History Chief Complaint  Patient presents with  . right rib pain    Jerome Kent is a 70 y.o. male.  The history is provided by the patient.  He has history  I hypertension, hyperlipidemia, systolic heart failure, atrial fibrillation not on anticoagulation comes in complaining of injury to the right rib cage.  He was sitting in a chair with arms when something dropped to the reached over to pick it up and hit his right rib cage against the arm of the chair.  Since then, he has pain in the right lower lateral rib cage which is worse with movement and worse with breathing.  He denies dyspnea.  He has been taking acetaminophen without relief.  He denies other injury.  Past Medical History:  Diagnosis Date  . Acute systolic heart failure (HCC)   . Atrial flutter (HCC)   . Cardiomyopathy (HCC)   . Chronic systolic heart failure (HCC)    EF 15% on TEE 9/14 during cardioversion  . Dysrhythmia    atrial flutter  . Erectile dysfunction   . Hyperlipidemia   . Obesity   . OSA (obstructive sleep apnea)   . Osteoarthritis   . Shortness of breath   . Sleep apnea     Patient Active Problem List   Diagnosis Date Noted  . Atrial fibrillation (HCC) 09/17/2018  . Hyperlipidemia 09/17/2018  . OSA (obstructive sleep apnea) 09/16/2018  . Unstable angina (HCC) 09/16/2018  . Typical atrial flutter (HCC) 02/09/2017  . Chronic systolic heart failure (HCC)   . Cardiomyopathy (HCC)   . Obesity   . Atrial flutter (HCC) 09/15/2013    Past Surgical History:  Procedure Laterality Date  . A-FLUTTER ABLATION N/A 02/09/2017   Procedure: A-Flutter Ablation;  Surgeon: Will Jorja Loa, MD;  Location: [facility] INVASIVE CV LAB;  Service: Cardiovascular;  Laterality: N/A;  . APPENDECTOMY    . CARDIOVERSION N/A 09/15/2013   Procedure: CARDIOVERSION;  Surgeon: [name], MD;   Location: [facility] ENDOSCOPY;  Service: Cardiovascular;  Laterality: N/A;  . CARDIOVERSION N/A 01/11/2017   Procedure: CARDIOVERSION;  Surgeon: Jake Bathe, MD;  Location: [facility] ENDOSCOPY;  Service: Cardiovascular;  Laterality: N/A;  . LEFT HEART CATHETERIZATION WITH CORONARY ANGIOGRAM N/A 10/29/2013   Procedure: LEFT HEART CATHETERIZATION WITH CORONARY ANGIOGRAM;  Surgeon: [name], MD;  Location: [facility] CATH LAB;  Service: Cardiovascular;  Laterality: N/A;  . RIGHT/LEFT HEART CATH AND CORONARY ANGIOGRAPHY N/A 09/17/2018   Procedure: RIGHT/LEFT HEART CATH AND CORONARY ANGIOGRAPHY;  Surgeon: Corky Crafts, MD;  Location: [facility] INVASIVE CV LAB;  Service: Cardiovascular;  Laterality: N/A;  . TEE WITHOUT CARDIOVERSION N/A 09/15/2013   Procedure: TRANSESOPHAGEAL ECHOCARDIOGRAM (TEE);  Surgeon: [name], MD;  Location: [facility] ENDOSCOPY;  Service: Cardiovascular;  Laterality: N/A;  . TEE WITHOUT CARDIOVERSION N/A 01/11/2017   Procedure: TRANSESOPHAGEAL ECHOCARDIOGRAM (TEE);  Surgeon: Jake Bathe, MD;  Location: [facility] ENDOSCOPY;  Service: Cardiovascular;  Laterality: N/A;  . TOTAL HIP ARTHROPLASTY Right 2012  . ULTRASOUND GUIDANCE FOR VASCULAR ACCESS  09/17/2018   Procedure: Ultrasound Guidance For Vascular Access;  Surgeon: Corky Crafts, MD;  Location: [facility] INVASIVE CV LAB;  Service: Cardiovascular;;       Family History  Problem Relation Age of Onset  . Diabetes Father   . Cancer Father   . Stroke Father   . Diabetes Brother   . Hypertension Mother   .  Heart attack Other     Social History   Tobacco Use  . Smoking status: Former Smoker    Quit date: 12/25/1994    Years since quitting: 26.3  . Smokeless tobacco: Never Used  Vaping Use  . Vaping Use: Never used  Substance Use Topics  . Alcohol use: Yes    Alcohol/week: 6.0 standard drinks    Types: 6 Cans of beer per week  . Drug use: No    Home Medications Prior to Admission medications   Medication Sig Start Date End Date Taking?  Authorizing Provider  oxyCODONE (ROXICODONE) 5 MG immediate release tablet Take 1 tablet (5 mg total) by mouth every 4 (four) hours as needed for severe pain. 04/14/21  Yes Dione Booze, MD  albuterol (VENTOLIN HFA) 108 (90 Base) MCG/ACT inhaler Inhale 2 puffs into the lungs every 6 (six) hours as needed for wheezing or shortness of breath. 11/21/20   Hall-Potvin, [place], PA-C  atorvastatin (LIPITOR) 40 MG tablet Take 1 tablet (40 mg total) by mouth daily. 11/24/15   Jake Bathe, MD  Cyanocobalamin (B-12 PO) Take 1 tablet by mouth daily.    [provider]  furosemide (LASIX) 20 MG tablet Take 1 tablet (20 mg total) by mouth daily. Please make yearly appt with Dr. Anne Fu for March 2022 before anymore refills. Thank you 1st attempt 02/09/21   Jake Bathe, MD  lisinopril (ZESTRIL) 5 MG tablet Take 1 tablet (5 mg total) by mouth daily. Please make yearly appt with Dr. Anne Fu for March 2022 before anymore refills. Thank you 1st attempt 02/09/21   Jake Bathe, MD  metoprolol succinate (TOPROL-XL) 25 MG 24 hr tablet TAKE 1 TABLET BY MOUTH EVERY DAY 05/25/20   Jake Bathe, MD  PRESCRIPTION MEDICATION CPAP: At bedtime only    [provider]    Allergies    Patient has no known allergies.  Review of Systems   Review of Systems  All other systems reviewed and are negative.   Physical Exam Updated Vital Signs BP (!) 149/89 (BP Location: Left Arm)   Pulse (!) 55   Temp 98.1 F (36.7 C) (Oral)   Resp 17   Ht 5\' 9"  (1.753 m)   Wt (!) 137.9 kg   SpO2 96%   BMI 44.90 kg/m   Physical Exam Vitals and nursing note reviewed.   70 year old male, resting comfortably and in no acute distress. Vital signs are significant for elevated blood pressure and slightly slow heart rate. Oxygen saturation is 96%, which is normal. Head is normocephalic and atraumatic. PERRLA, EOMI. Oropharynx is clear. Neck is nontender and supple without adenopathy or JVD. Back is nontender and there  is no CVA tenderness. Lungs are clear without rales, wheezes, or rhonchi. Chest is markedly tender over the right lateral rib cage inferiorly.  There is no crepitus. Heart has regular rate and rhythm without murmur. Abdomen is soft, flat, nontender without masses or hepatosplenomegaly and peristalsis is normoactive. Extremities have 1-2+ edema, full range of motion is present. Skin is warm and dry without rash. Neurologic: Mental status is normal, cranial nerves are intact, there are no motor or sensory deficits.  ED Results / Procedures / Treatments    Radiology DG Ribs Unilateral W/Chest Right  Result Date: 04/13/2021 CLINICAL DATA:  Right-sided rib pain following bending, initial encounter EXAM: RIGHT RIBS AND CHEST - 3+ VIEW COMPARISON:  11/22/2019 FINDINGS: Cardiac shadow is at the upper limits of normal in size. Mild  left basilar atelectasis is seen. Lungs are otherwise clear. Mildly displaced fracture of the right tenth rib is noted posteriorly. No pneumothorax is noted. IMPRESSION: Right tenth rib fracture without complicating factors. Electronically Signed   By: Alcide Clever M.D.   On: 04/13/2021 23:34    Procedures Procedures   Medications Ordered in ED Medications  oxyCODONE-acetaminophen (PERCOCET/ROXICET) 5-325 MG per tablet 1 tablet (has no administration in time range)    ED Course  I have reviewed the triage vital signs and the nursing notes.  Pertinent imaging results that were available during my care of the patient were reviewed by me and considered in my medical decision making (see chart for details).  MDM Rules/Calculators/A&P Injury to right rib cage.  X-rays show fracture of the 10th rib without pneumothorax.  Old records reviewed, and he has no relevant past visits.  He is discharged with prescription for oxycodone, told to use over-the-counter analgesics as his main analgesic.  Follow-up with PCP if he needs additional narcotic prescriptions.  Final Clinical  Impression(s) / ED Diagnoses Final diagnoses:  Closed fracture of one rib of right side, initial encounter  Elevated blood pressure reading with diagnosis of hypertension  Peripheral edema    Rx / DC Orders ED Discharge Orders         Ordered    oxyCODONE (ROXICODONE) 5 MG immediate release tablet  Every 4 hours PRN        04/14/21 0252           Dione Booze, MD 04/14/21 [phone]

## 2021-04-15 NOTE — Addendum Note (Signed)
Addended by: Louanne Belton, Georgann Bramble A on: 04/15/2021 05:22 PM   Modules accepted: Orders

## 2021-04-21 ENCOUNTER — Encounter (HOSPITAL_COMMUNITY): Payer: Self-pay

## 2021-05-02 ENCOUNTER — Other Ambulatory Visit: Payer: Self-pay | Admitting: Cardiology

## 2021-05-02 DIAGNOSIS — I5022 Chronic systolic (congestive) heart failure: Secondary | ICD-10-CM

## 2021-05-06 DIAGNOSIS — H35372 Puckering of macula, left eye: Secondary | ICD-10-CM | POA: Diagnosis not present

## 2021-05-06 DIAGNOSIS — H43392 Other vitreous opacities, left eye: Secondary | ICD-10-CM | POA: Diagnosis not present

## 2021-05-06 DIAGNOSIS — H33312 Horseshoe tear of retina without detachment, left eye: Secondary | ICD-10-CM | POA: Diagnosis not present

## 2021-05-10 DIAGNOSIS — E782 Mixed hyperlipidemia: Secondary | ICD-10-CM | POA: Diagnosis not present

## 2021-05-10 DIAGNOSIS — I5022 Chronic systolic (congestive) heart failure: Secondary | ICD-10-CM | POA: Diagnosis not present

## 2021-05-10 DIAGNOSIS — E7800 Pure hypercholesterolemia, unspecified: Secondary | ICD-10-CM | POA: Diagnosis not present

## 2021-05-20 DIAGNOSIS — H33102 Unspecified retinoschisis, left eye: Secondary | ICD-10-CM | POA: Diagnosis not present

## 2021-05-20 DIAGNOSIS — H353131 Nonexudative age-related macular degeneration, bilateral, early dry stage: Secondary | ICD-10-CM | POA: Diagnosis not present

## 2021-05-20 DIAGNOSIS — H35363 Drusen (degenerative) of macula, bilateral: Secondary | ICD-10-CM | POA: Diagnosis not present

## 2021-05-20 DIAGNOSIS — H43392 Other vitreous opacities, left eye: Secondary | ICD-10-CM | POA: Diagnosis not present

## 2021-06-06 DIAGNOSIS — G4733 Obstructive sleep apnea (adult) (pediatric): Secondary | ICD-10-CM | POA: Diagnosis not present

## 2021-06-28 DIAGNOSIS — G459 Transient cerebral ischemic attack, unspecified: Secondary | ICD-10-CM | POA: Diagnosis not present

## 2021-06-28 DIAGNOSIS — R001 Bradycardia, unspecified: Secondary | ICD-10-CM | POA: Diagnosis not present

## 2021-06-30 ENCOUNTER — Telehealth: Payer: Self-pay | Admitting: Cardiology

## 2021-06-30 NOTE — Telephone Encounter (Signed)
     STAT if HR is under 50 or over 120 (normal HR is 60-100 beats per minute)  What is your heart rate? 47-48  Do you have a log of your heart rate readings (document readings)?   Do you have any other symptoms? Fatigue  Thursday night he had an episode he looks like he is having a stroke, he went to see pcp and was told to hold metoprolol if HR lower than 50 pt is doing that, however pt HR been ranging 47-49, pcp said he needs to see Dr. Anne Fu for f/u

## 2021-06-30 NOTE — Telephone Encounter (Signed)
Spoke w Lesle Reek, pts girlfriend (DPR) Last Thursday night before bed pt had episode of sitting in chair and head started twitching, eyes closed was making a strange face.  She got his attention, he didn't know what was happening but had had a slight headache earlier that day at work and that evening afterward.  She gave him a baby aspirin.  He went back to work the next day and states he feels fine since.  She said he is extremely tired. They were unaware of his HR being low until seen by PCP Tuesday.   She states Dr. Katrinka Blazing called our office for recommendations and spoke w one of the doctors to discuss.  Dr. Charna Archer pt to hold Toprol XL for HR<50 and if >50 take half tablet.  HR has been 47-49.    Dr. Katrinka Blazing recommends follow up with [facility] asap.  She is aware I will forward to Dr. Anne Fu and his nurse for review and we will call them back with any new recommendations.

## 2021-07-08 DIAGNOSIS — G4733 Obstructive sleep apnea (adult) (pediatric): Secondary | ICD-10-CM | POA: Diagnosis not present

## 2021-07-21 NOTE — Telephone Encounter (Signed)
Left message to call back  

## 2021-07-21 NOTE — Telephone Encounter (Signed)
Spoke with girlfriend, Lesle Reek.  She states HR is looking better on half tablet of Metoprolol.  Usually 54-57.  She mentioned that pt took a whole tablet this morning and went to work.  Advised to check HR when he gets home.  Advised to go back to a half tablet tomorrow since he runs in the 50s on that dose.  Barb agreeable to plan.  She inquired about an appt.  Advised I will have Dr. Anne Fu nurse follow up about an appt when she returns to the office.

## 2021-08-01 DIAGNOSIS — M5136 Other intervertebral disc degeneration, lumbar region: Secondary | ICD-10-CM | POA: Diagnosis not present

## 2021-08-01 DIAGNOSIS — M9902 Segmental and somatic dysfunction of thoracic region: Secondary | ICD-10-CM | POA: Diagnosis not present

## 2021-08-01 DIAGNOSIS — M5135 Other intervertebral disc degeneration, thoracolumbar region: Secondary | ICD-10-CM | POA: Diagnosis not present

## 2021-08-01 DIAGNOSIS — M9903 Segmental and somatic dysfunction of lumbar region: Secondary | ICD-10-CM | POA: Diagnosis not present

## 2021-08-01 DIAGNOSIS — M5137 Other intervertebral disc degeneration, lumbosacral region: Secondary | ICD-10-CM | POA: Diagnosis not present

## 2021-08-01 DIAGNOSIS — M9904 Segmental and somatic dysfunction of sacral region: Secondary | ICD-10-CM | POA: Diagnosis not present

## 2021-08-03 DIAGNOSIS — M5136 Other intervertebral disc degeneration, lumbar region: Secondary | ICD-10-CM | POA: Diagnosis not present

## 2021-08-03 DIAGNOSIS — M9904 Segmental and somatic dysfunction of sacral region: Secondary | ICD-10-CM | POA: Diagnosis not present

## 2021-08-03 DIAGNOSIS — M5135 Other intervertebral disc degeneration, thoracolumbar region: Secondary | ICD-10-CM | POA: Diagnosis not present

## 2021-08-03 DIAGNOSIS — M9902 Segmental and somatic dysfunction of thoracic region: Secondary | ICD-10-CM | POA: Diagnosis not present

## 2021-08-03 DIAGNOSIS — M9903 Segmental and somatic dysfunction of lumbar region: Secondary | ICD-10-CM | POA: Diagnosis not present

## 2021-08-03 DIAGNOSIS — M5137 Other intervertebral disc degeneration, lumbosacral region: Secondary | ICD-10-CM | POA: Diagnosis not present

## 2021-08-04 DIAGNOSIS — G459 Transient cerebral ischemic attack, unspecified: Secondary | ICD-10-CM | POA: Diagnosis not present

## 2021-08-04 DIAGNOSIS — I5022 Chronic systolic (congestive) heart failure: Secondary | ICD-10-CM | POA: Diagnosis not present

## 2021-08-04 DIAGNOSIS — E7800 Pure hypercholesterolemia, unspecified: Secondary | ICD-10-CM | POA: Diagnosis not present

## 2021-08-04 DIAGNOSIS — E782 Mixed hyperlipidemia: Secondary | ICD-10-CM | POA: Diagnosis not present

## 2021-08-05 DIAGNOSIS — M5137 Other intervertebral disc degeneration, lumbosacral region: Secondary | ICD-10-CM | POA: Diagnosis not present

## 2021-08-05 DIAGNOSIS — M5135 Other intervertebral disc degeneration, thoracolumbar region: Secondary | ICD-10-CM | POA: Diagnosis not present

## 2021-08-05 DIAGNOSIS — M9903 Segmental and somatic dysfunction of lumbar region: Secondary | ICD-10-CM | POA: Diagnosis not present

## 2021-08-05 DIAGNOSIS — M9902 Segmental and somatic dysfunction of thoracic region: Secondary | ICD-10-CM | POA: Diagnosis not present

## 2021-08-05 DIAGNOSIS — M5136 Other intervertebral disc degeneration, lumbar region: Secondary | ICD-10-CM | POA: Diagnosis not present

## 2021-08-05 DIAGNOSIS — M9904 Segmental and somatic dysfunction of sacral region: Secondary | ICD-10-CM | POA: Diagnosis not present

## 2021-08-08 DIAGNOSIS — M9902 Segmental and somatic dysfunction of thoracic region: Secondary | ICD-10-CM | POA: Diagnosis not present

## 2021-08-08 DIAGNOSIS — M5135 Other intervertebral disc degeneration, thoracolumbar region: Secondary | ICD-10-CM | POA: Diagnosis not present

## 2021-08-08 DIAGNOSIS — M5137 Other intervertebral disc degeneration, lumbosacral region: Secondary | ICD-10-CM | POA: Diagnosis not present

## 2021-08-08 DIAGNOSIS — M9904 Segmental and somatic dysfunction of sacral region: Secondary | ICD-10-CM | POA: Diagnosis not present

## 2021-08-08 DIAGNOSIS — M9903 Segmental and somatic dysfunction of lumbar region: Secondary | ICD-10-CM | POA: Diagnosis not present

## 2021-08-08 DIAGNOSIS — M5136 Other intervertebral disc degeneration, lumbar region: Secondary | ICD-10-CM | POA: Diagnosis not present

## 2021-08-24 DIAGNOSIS — H35363 Drusen (degenerative) of macula, bilateral: Secondary | ICD-10-CM | POA: Diagnosis not present

## 2021-08-24 DIAGNOSIS — H31092 Other chorioretinal scars, left eye: Secondary | ICD-10-CM | POA: Diagnosis not present

## 2021-08-24 DIAGNOSIS — H33312 Horseshoe tear of retina without detachment, left eye: Secondary | ICD-10-CM | POA: Diagnosis not present

## 2021-08-24 DIAGNOSIS — H35453 Secondary pigmentary degeneration, bilateral: Secondary | ICD-10-CM | POA: Diagnosis not present

## 2021-08-24 DIAGNOSIS — H353131 Nonexudative age-related macular degeneration, bilateral, early dry stage: Secondary | ICD-10-CM | POA: Diagnosis not present

## 2021-08-24 DIAGNOSIS — H25813 Combined forms of age-related cataract, bilateral: Secondary | ICD-10-CM | POA: Diagnosis not present

## 2021-09-05 ENCOUNTER — Telehealth: Payer: Self-pay | Admitting: Cardiology

## 2021-09-05 NOTE — Telephone Encounter (Signed)
Returned call to [place] and advised per Dr Anne Fu pt is ok to take sildenafil. She stated she will call to notify the patient.

## 2021-09-05 NOTE — Telephone Encounter (Signed)
Patient PCP wanted him to call to see if it would be okay for him to take Viagra. Please advise

## 2021-09-05 NOTE — Telephone Encounter (Signed)
Pt c/o medication issue:  1. Name of Medication: sildenafil   2. How are you currently taking this medication (dosage and times per day)? Has not started  3. Are you having a reaction (difficulty breathing--STAT)? no  4. What is your medication issue? Lafonda Mosses from [facility] calling to see if the patient can start sildenafil again. Phone: [phone]

## 2021-09-05 NOTE — Telephone Encounter (Signed)
Returned call to [place] and advised per Dr Skains pt is ok to take sildenafil. She stated she will call to notify the patient. 

## 2021-09-05 NOTE — Telephone Encounter (Signed)
Will forward to Dr Skains for review and recommendations 

## 2021-09-06 DIAGNOSIS — G459 Transient cerebral ischemic attack, unspecified: Secondary | ICD-10-CM | POA: Diagnosis not present

## 2021-09-06 DIAGNOSIS — E782 Mixed hyperlipidemia: Secondary | ICD-10-CM | POA: Diagnosis not present

## 2021-09-06 DIAGNOSIS — I5022 Chronic systolic (congestive) heart failure: Secondary | ICD-10-CM | POA: Diagnosis not present

## 2021-09-06 DIAGNOSIS — E7800 Pure hypercholesterolemia, unspecified: Secondary | ICD-10-CM | POA: Diagnosis not present

## 2021-10-17 DIAGNOSIS — E7800 Pure hypercholesterolemia, unspecified: Secondary | ICD-10-CM | POA: Diagnosis not present

## 2021-10-17 DIAGNOSIS — I5022 Chronic systolic (congestive) heart failure: Secondary | ICD-10-CM | POA: Diagnosis not present

## 2021-10-17 DIAGNOSIS — G459 Transient cerebral ischemic attack, unspecified: Secondary | ICD-10-CM | POA: Diagnosis not present

## 2021-10-17 DIAGNOSIS — E782 Mixed hyperlipidemia: Secondary | ICD-10-CM | POA: Diagnosis not present

## 2021-10-20 DIAGNOSIS — G4733 Obstructive sleep apnea (adult) (pediatric): Secondary | ICD-10-CM | POA: Diagnosis not present

## 2021-11-02 DIAGNOSIS — G459 Transient cerebral ischemic attack, unspecified: Secondary | ICD-10-CM | POA: Diagnosis not present

## 2021-11-02 DIAGNOSIS — I5022 Chronic systolic (congestive) heart failure: Secondary | ICD-10-CM | POA: Diagnosis not present

## 2021-11-02 DIAGNOSIS — E782 Mixed hyperlipidemia: Secondary | ICD-10-CM | POA: Diagnosis not present

## 2021-11-02 DIAGNOSIS — E7800 Pure hypercholesterolemia, unspecified: Secondary | ICD-10-CM | POA: Diagnosis not present

## 2021-11-12 IMAGING — DX DG CHEST 2V
2 series · 2 of 2 positions shown · non-contrast
Comparison: September 16, 2018

CLINICAL DATA: Cough and congestion

EXAM:
CHEST - 2 VIEW

[chest pa]
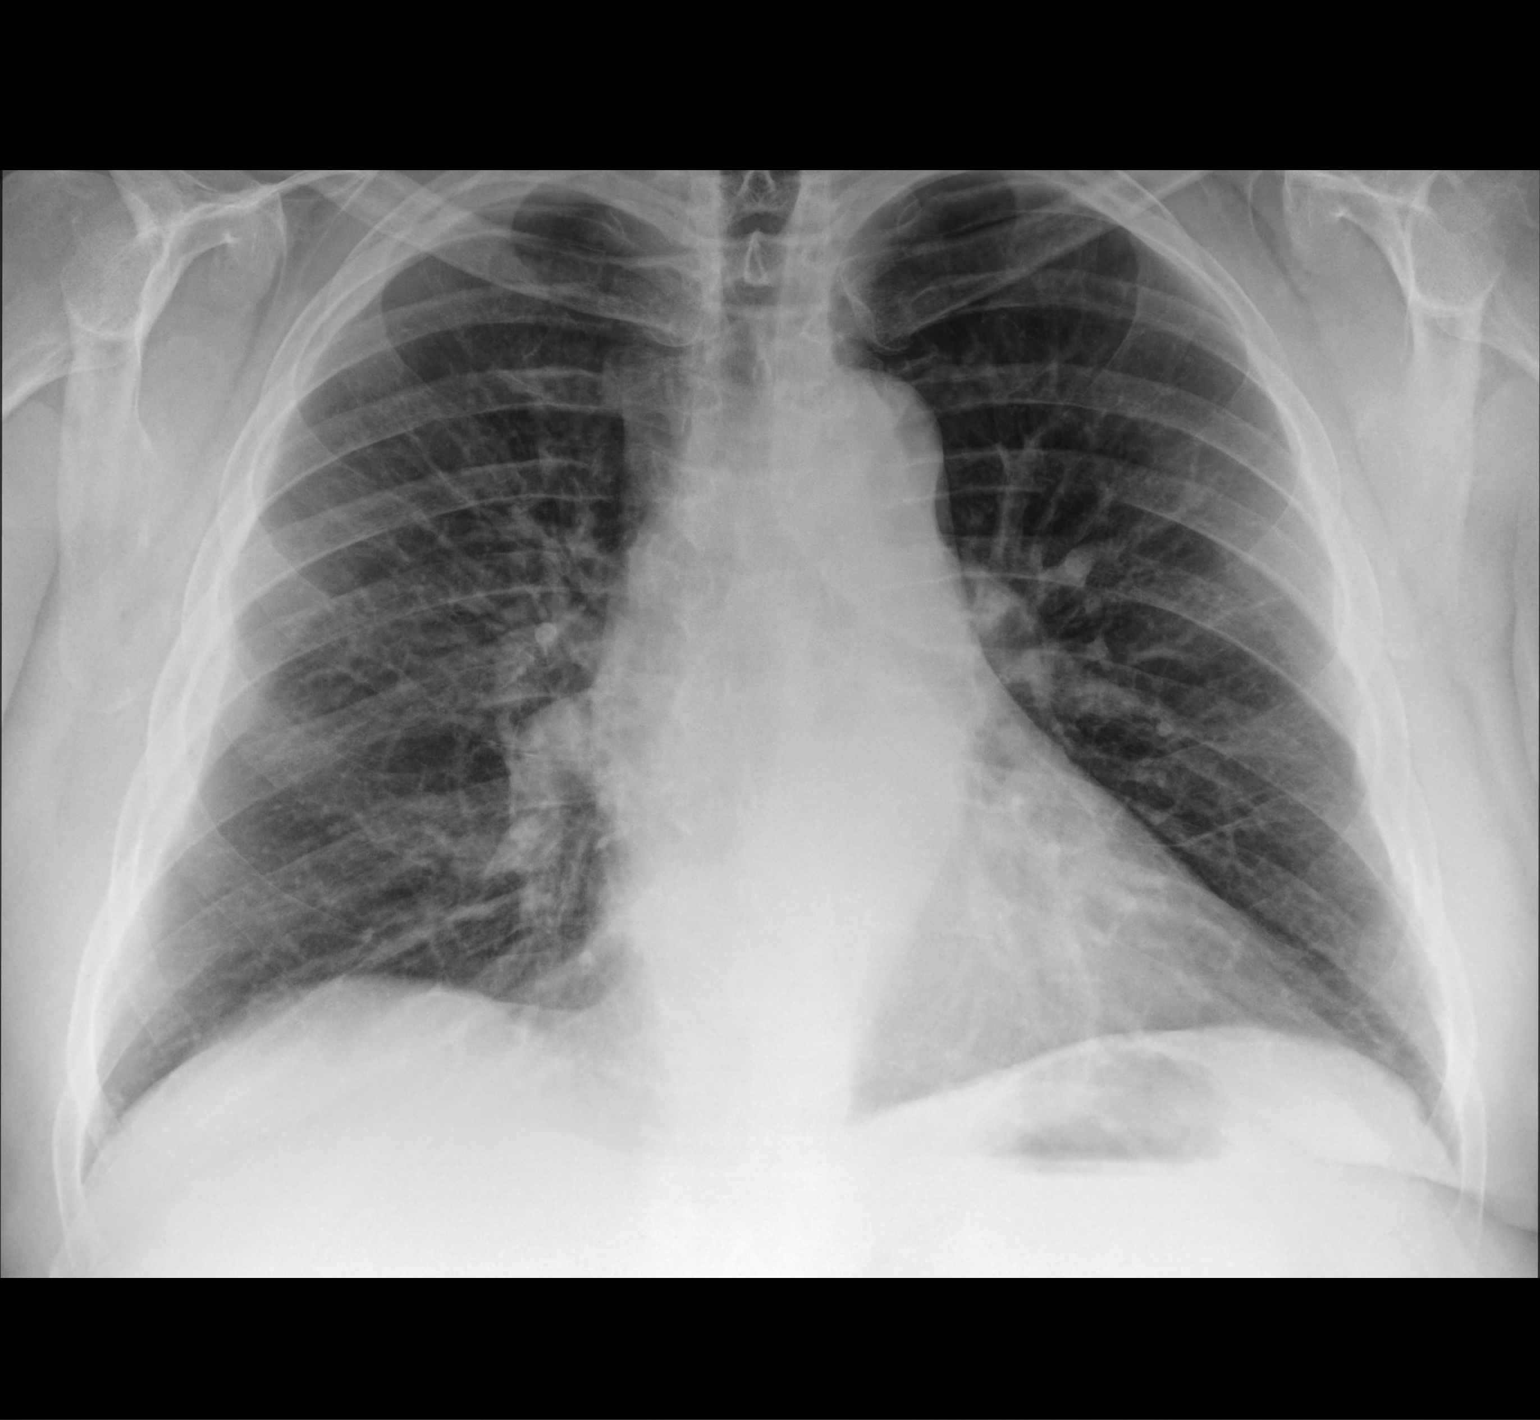

[chest lat]
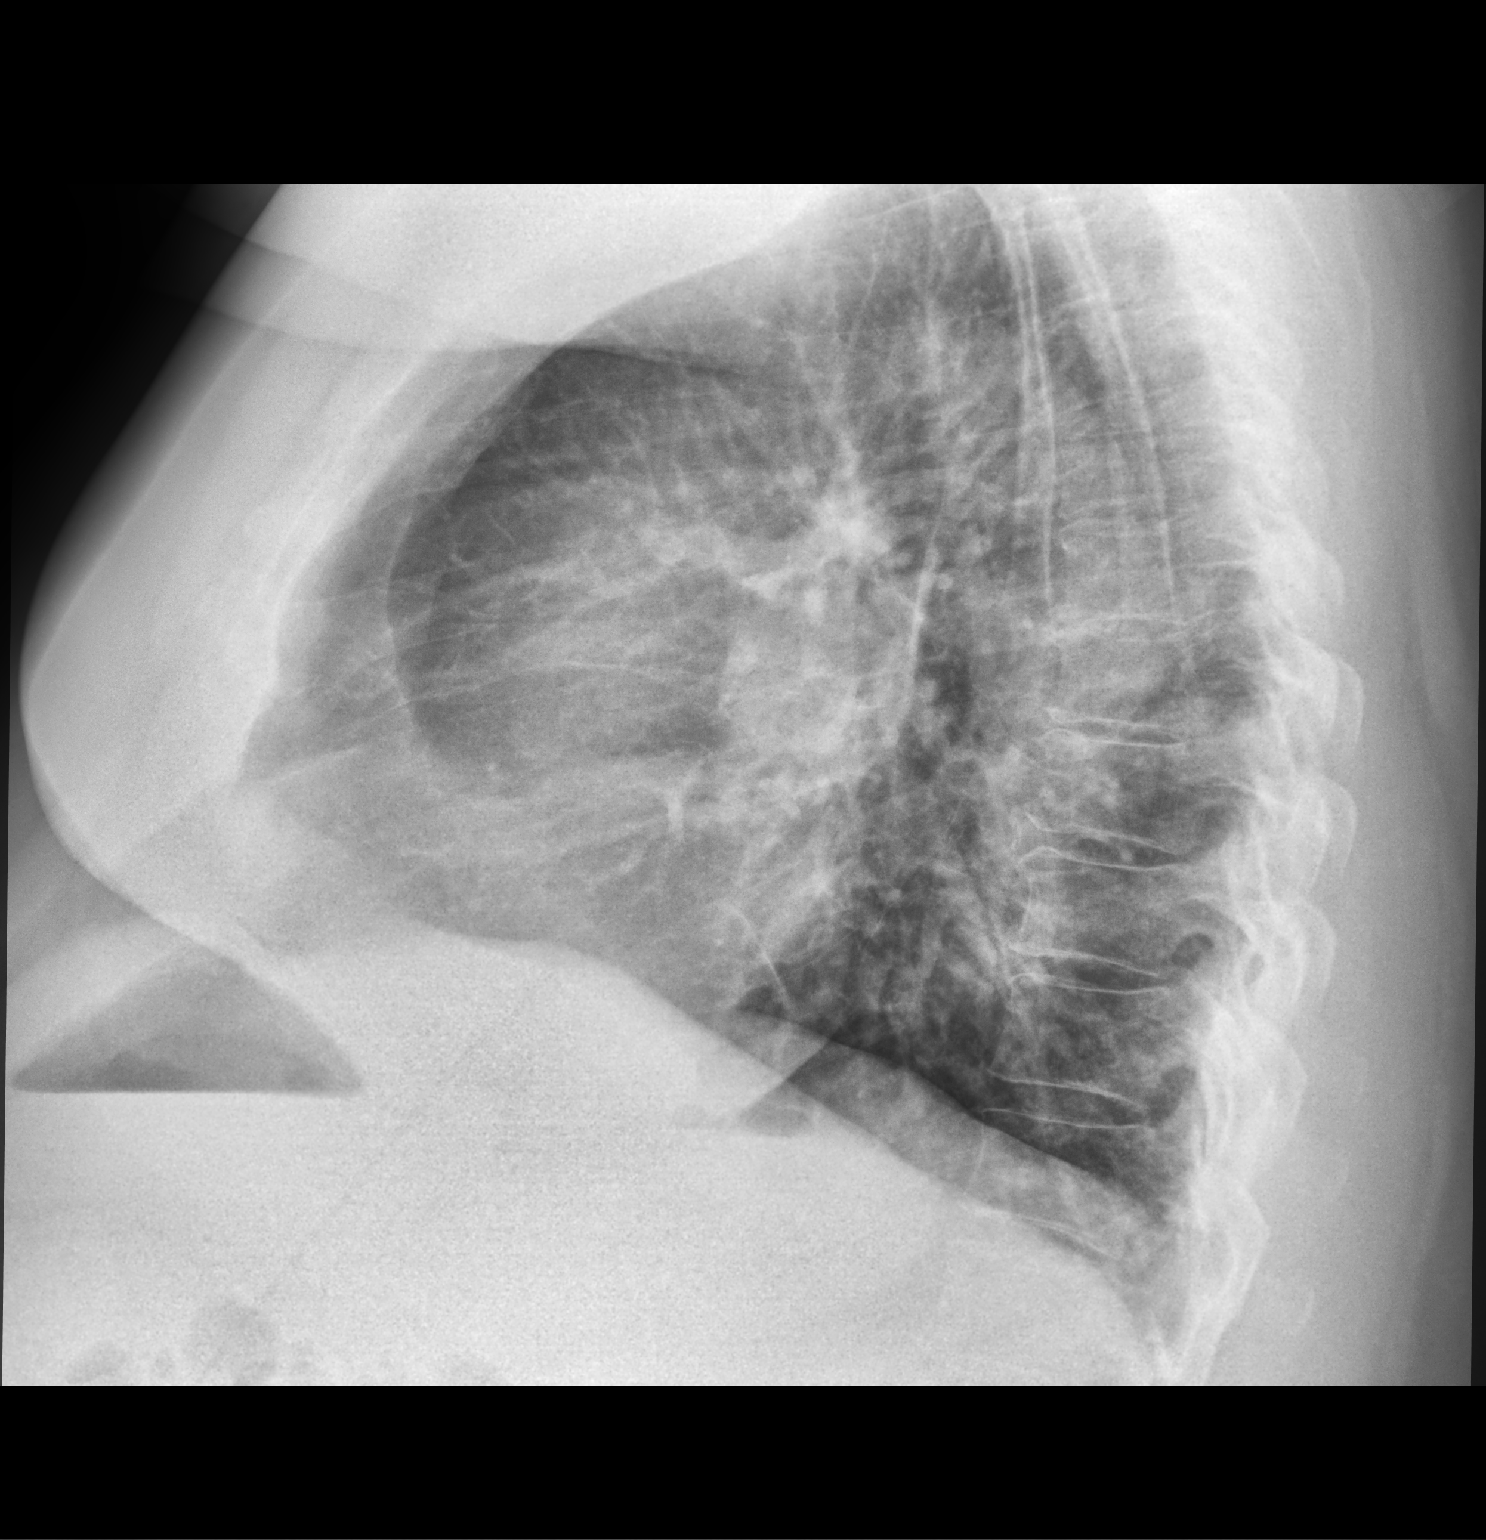

[2 of 2 positions shown; findings below may reference images not displayed]

FINDINGS: Minimal scarring left base. Lungs otherwise clear. Heart size and
pulmonary vascularity are normal. No adenopathy. No bone lesions.
IMPRESSION: Minimal scarring left base. No edema or airspace opacity. Cardiac
silhouette normal.

## 2022-01-16 DIAGNOSIS — E782 Mixed hyperlipidemia: Secondary | ICD-10-CM | POA: Diagnosis not present

## 2022-01-16 DIAGNOSIS — R7303 Prediabetes: Secondary | ICD-10-CM | POA: Diagnosis not present

## 2022-01-16 DIAGNOSIS — I5022 Chronic systolic (congestive) heart failure: Secondary | ICD-10-CM | POA: Diagnosis not present

## 2022-01-16 DIAGNOSIS — Z1389 Encounter for screening for other disorder: Secondary | ICD-10-CM | POA: Diagnosis not present

## 2022-01-16 DIAGNOSIS — Z125 Encounter for screening for malignant neoplasm of prostate: Secondary | ICD-10-CM | POA: Diagnosis not present

## 2022-01-16 DIAGNOSIS — Z23 Encounter for immunization: Secondary | ICD-10-CM | POA: Diagnosis not present

## 2022-01-16 DIAGNOSIS — I4892 Unspecified atrial flutter: Secondary | ICD-10-CM | POA: Diagnosis not present

## 2022-01-16 DIAGNOSIS — G4733 Obstructive sleep apnea (adult) (pediatric): Secondary | ICD-10-CM | POA: Diagnosis not present

## 2022-01-16 DIAGNOSIS — Z0000 Encounter for general adult medical examination without abnormal findings: Secondary | ICD-10-CM | POA: Diagnosis not present

## 2022-01-31 DIAGNOSIS — E782 Mixed hyperlipidemia: Secondary | ICD-10-CM | POA: Diagnosis not present

## 2022-01-31 DIAGNOSIS — I5022 Chronic systolic (congestive) heart failure: Secondary | ICD-10-CM | POA: Diagnosis not present

## 2022-03-04 DIAGNOSIS — I4891 Unspecified atrial fibrillation: Secondary | ICD-10-CM | POA: Diagnosis not present

## 2022-03-04 DIAGNOSIS — I480 Paroxysmal atrial fibrillation: Secondary | ICD-10-CM | POA: Diagnosis not present

## 2022-03-04 DIAGNOSIS — E785 Hyperlipidemia, unspecified: Secondary | ICD-10-CM | POA: Diagnosis not present

## 2022-03-04 DIAGNOSIS — I10 Essential (primary) hypertension: Secondary | ICD-10-CM | POA: Diagnosis not present

## 2022-03-04 DIAGNOSIS — Z7982 Long term (current) use of aspirin: Secondary | ICD-10-CM | POA: Diagnosis not present

## 2022-03-04 DIAGNOSIS — I5022 Chronic systolic (congestive) heart failure: Secondary | ICD-10-CM | POA: Diagnosis not present

## 2022-03-04 DIAGNOSIS — I110 Hypertensive heart disease with heart failure: Secondary | ICD-10-CM | POA: Diagnosis not present

## 2022-03-04 DIAGNOSIS — Z87891 Personal history of nicotine dependence: Secondary | ICD-10-CM | POA: Diagnosis not present

## 2022-03-04 DIAGNOSIS — E669 Obesity, unspecified: Secondary | ICD-10-CM | POA: Diagnosis not present

## 2022-03-04 DIAGNOSIS — I499 Cardiac arrhythmia, unspecified: Secondary | ICD-10-CM | POA: Diagnosis not present

## 2022-03-04 DIAGNOSIS — E7800 Pure hypercholesterolemia, unspecified: Secondary | ICD-10-CM | POA: Diagnosis not present

## 2022-03-04 DIAGNOSIS — G4733 Obstructive sleep apnea (adult) (pediatric): Secondary | ICD-10-CM | POA: Diagnosis not present

## 2022-04-17 ENCOUNTER — Ambulatory Visit: Payer: PPO | Admitting: Cardiology

## 2022-04-17 ENCOUNTER — Encounter: Payer: Self-pay | Admitting: Cardiology

## 2022-04-17 DIAGNOSIS — I483 Typical atrial flutter: Secondary | ICD-10-CM | POA: Diagnosis not present

## 2022-04-17 DIAGNOSIS — G4733 Obstructive sleep apnea (adult) (pediatric): Secondary | ICD-10-CM

## 2022-04-17 DIAGNOSIS — I428 Other cardiomyopathies: Secondary | ICD-10-CM

## 2022-04-17 DIAGNOSIS — E7800 Pure hypercholesterolemia, unspecified: Secondary | ICD-10-CM | POA: Diagnosis not present

## 2022-04-17 DIAGNOSIS — I5022 Chronic systolic (congestive) heart failure: Secondary | ICD-10-CM | POA: Diagnosis not present

## 2022-04-17 NOTE — Assessment & Plan Note (Signed)
Overall doing well with NYHA class I symptoms.  Ejection fraction originally was 35%.  On most recent check 65%.  He had relative hypotension in the past which restricted goal-directed medical therapy.  It was thought that his systolic heart failure could have been tachycardia induced.  He has had a atrial flutter ablation in the past.  Continuing with lisinopril 5 mg once a day and Toprol 25 mg daily. ?

## 2022-04-17 NOTE — Assessment & Plan Note (Signed)
Atrial flutter ablation in 2018 by Dr. Elberta Fortis successful. ?

## 2022-04-17 NOTE — Patient Instructions (Signed)
Medication Instructions:  ?The current medical regimen is effective;  continue present plan and medications. ? ? ?*If you need a refill on your cardiac medications before your next appointment, please call your pharmacy* ? ?Follow-Up: ?At [facility], you and your health needs are our priority.  As part of our continuing mission to provide you with exceptional heart care, we have created designated Provider Care Teams.  These Care Teams include your primary Cardiologist (physician) and Advanced Practice Providers (APPs -  Physician Assistants and Nurse Practitioners) who all work together to provide you with the care you need, when you need it. ? ?We recommend signing up for the patient portal called "MyChart".  Sign up information is provided on this After Visit Summary.  MyChart is used to connect with patients for Virtual Visits (Telemedicine).  Patients are able to view lab/test results, encounter notes, upcoming appointments, etc.  Non-urgent messages can be sent to your provider as well.   ?To learn more about what you can do with MyChart, go to [email or web address].   ? ?Your next appointment:   ?1 year(s) ? ?The format for your next appointment:   ?In Person ? ?Provider:   ?Mark Skains, MD   ? ? ?Thank you for choosing [facility]!! ? ? ? ?Important Information About Sugar ? ? ? ? ?  ?

## 2022-04-17 NOTE — Assessment & Plan Note (Signed)
On atorvastatin 40 mg.  Prior LDL 119.  Hemoglobin A1c 6. ?

## 2022-04-17 NOTE — Assessment & Plan Note (Addendum)
Excellent job, he is down 50 pounds from last year.  Continue to encourage weight loss.  Decrease carbohydrates. ?

## 2022-04-17 NOTE — Assessment & Plan Note (Signed)
Felt to be possibly tachycardia mediated or strictly nonischemic. ?

## 2022-04-17 NOTE — Assessment & Plan Note (Addendum)
Stopped wearing CPAP.  Since weight loss has stopped wearing it.  States that he feels rested.  He will be seeing sleep clinic follow-up soon to discuss. ?

## 2022-04-17 NOTE — Progress Notes (Signed)
?Cardiology Office Note:   ? ?Date:  04/17/2022  ? ?ID:  Jerome Kent, DOB Apr 28, 1951, MRN [id number] ? ?PCP:  Carol Ada, MD ?  ?[facility] Providers ?Cardiologist:  Candee Furbish, MD    ? ?Referring MD: Carol Ada, MD  ? ? ?History of Present Illness:   ? ?Jerome Kent is a 71 y.o. male here for follow-up dilated fatigue with prior atrial flutter ablation in the past, brief episode of atrial flutter noted during cardiac cath in 2019. Very mild SOB. No CP. Occasional GERD.  ? ?In January 2022 had COVID with cough.  Recovered well. ? ?Has battled weight for quite some time.  Ultimately at this visit, he is down 50 pounds.  Excellent. ? ?Previously called for utilization of sildenafil.  ED. ? ?Past Medical History:  ?Diagnosis Date  ? Acute systolic heart failure ([place])   ? Atrial flutter ([place])   ? Cardiomyopathy ([place])   ? Chronic systolic heart failure ([place])   ? EF 15% on TEE 9/14 during cardioversion  ? Dysrhythmia   ? atrial flutter  ? Erectile dysfunction   ? Hyperlipidemia   ? Obesity   ? OSA (obstructive sleep apnea)   ? Osteoarthritis   ? Shortness of breath   ? Sleep apnea   ? ? ?Past Surgical History:  ?Procedure Laterality Date  ? A-FLUTTER ABLATION N/A 02/09/2017  ? Procedure: A-Flutter Ablation;  Surgeon: Will Meredith Leeds, MD;  Location: [place] CV LAB;  Service: Cardiovascular;  Laterality: N/A;  ? APPENDECTOMY    ? CARDIOVERSION N/A 09/15/2013  ? Procedure: CARDIOVERSION;  Surgeon: Candee Furbish, MD;  Location: [facility] ENDOSCOPY;  Service: Cardiovascular;  Laterality: N/A;  ? CARDIOVERSION N/A 01/11/2017  ? Procedure: CARDIOVERSION;  Surgeon: Jerline Pain, MD;  Location: [place];  Service: Cardiovascular;  Laterality: N/A;  ? LEFT HEART CATHETERIZATION WITH CORONARY ANGIOGRAM N/A 10/29/2013  ? Procedure: LEFT HEART CATHETERIZATION WITH CORONARY ANGIOGRAM;  Surgeon: Candee Furbish, MD;  Location: [facility] CATH LAB;  Service: Cardiovascular;  Laterality: N/A;  ? RIGHT/LEFT HEART CATH AND  CORONARY ANGIOGRAPHY N/A 09/17/2018  ? Procedure: RIGHT/LEFT HEART CATH AND CORONARY ANGIOGRAPHY;  Surgeon: Jettie Booze, MD;  Location: [place] CV LAB;  Service: Cardiovascular;  Laterality: N/A;  ? TEE WITHOUT CARDIOVERSION N/A 09/15/2013  ? Procedure: TRANSESOPHAGEAL ECHOCARDIOGRAM (TEE);  Surgeon: Candee Furbish, MD;  Location: [facility] ENDOSCOPY;  Service: Cardiovascular;  Laterality: N/A;  ? TEE WITHOUT CARDIOVERSION N/A 01/11/2017  ? Procedure: TRANSESOPHAGEAL ECHOCARDIOGRAM (TEE);  Surgeon: Jerline Pain, MD;  Location: [place];  Service: Cardiovascular;  Laterality: N/A;  ? TOTAL HIP ARTHROPLASTY Right 2012  ? ULTRASOUND GUIDANCE FOR VASCULAR ACCESS  09/17/2018  ? Procedure: Ultrasound Guidance For Vascular Access;  Surgeon: Jettie Booze, MD;  Location: [place] CV LAB;  Service: Cardiovascular;;  ? ? ?Current Medications: ?Current Meds  ?Medication Sig  ? albuterol (VENTOLIN HFA) 108 (90 Base) MCG/ACT inhaler Inhale 2 puffs into the lungs every 6 (six) hours as needed for wheezing or shortness of breath.  ? atorvastatin (LIPITOR) 40 MG tablet Take 1 tablet (40 mg total) by mouth daily.  ? Cyanocobalamin (B-12 PO) Take 1 tablet by mouth daily.  ? furosemide (LASIX) 20 MG tablet TAKE ONE TABLET BY MOUTH EVERY MORNING  ? lisinopril (ZESTRIL) 5 MG tablet TAKE ONE TABLET BY MOUTH EVERY MORNING  ? metoprolol succinate (TOPROL-XL) 25 MG 24 hr tablet TAKE ONE TABLET BY MOUTH EVERY MORNING  ? oxyCODONE (ROXICODONE) 5 MG immediate  release tablet Take 1 tablet (5 mg total) by mouth every 4 (four) hours as needed for severe pain.  ? PRESCRIPTION MEDICATION CPAP: At bedtime only  ?  ? ?Allergies:   Patient has no known allergies.  ? ?Social History  ? ?Socioeconomic History  ? Marital status: Significant Other  ?  Spouse name: Not on file  ? Number of children: Not on file  ? Years of education: Not on file  ? Highest education level: Not on file  ?Occupational History  ? Not on file  ?Tobacco Use  ?  Smoking status: Former  ?  Types: Cigarettes  ?  Quit date: 12/25/1994  ?  Years since quitting: 27.3  ? Smokeless tobacco: Never  ?Vaping Use  ? Vaping Use: Never used  ?Substance and Sexual Activity  ? Alcohol use: Yes  ?  Alcohol/week: 6.0 standard drinks  ?  Types: 6 Cans of beer per week  ? Drug use: No  ? Sexual activity: Yes  ?Other Topics Concern  ? Not on file  ?Social History Narrative  ? ** Merged History Encounter **  ?    ? ?Social Determinants of Health  ? ?Financial Resource Strain: Not on file  ?Food Insecurity: Not on file  ?Transportation Needs: Not on file  ?Physical Activity: Not on file  ?Stress: Not on file  ?Social Connections: Not on file  ?  ? ?Family History: ?The patient's family history includes Cancer in his father; Diabetes in his brother and father; Heart attack in an other family member; Hypertension in his mother; Stroke in his father. ? ?ROS:   ?Please see the history of present illness.    ? All other systems reviewed and are negative. ? ?EKGs/Labs/Other Studies Reviewed:   ? ?The following studies were reviewed today: ? ?ECHO 2018: ? ?- Left ventricle: The cavity size was normal. Wall thickness was  ?  normal. Systolic function was normal. The estimated ejection  ?  fraction was in the range of 60% to 65%. Wall motion was normal;  ?  there were no regional wall motion abnormalities. Features are  ?  consistent with a pseudonormal left ventricular filling pattern,  ?  with concomitant abnormal relaxation and increased filling  ?  pressure (grade 2 diastolic dysfunction).  ?- Aortic valve: Mildly calcified annulus.  ?- Mitral valve: There was mild regurgitation.  ? ?EKG:  EKG is  ordered today.  The ekg ordered today demonstrates sinus bradycardia 51 left axis deviation nonspecific interventricular conduction delay, no change from prior ? ?Recent Labs: ?No results found for requested labs within last 8760 hours.  ?Recent Lipid Panel ?   ?Component Value Date/Time  ? CHOL 165  09/17/2018 0550  ? TRIG 139 09/17/2018 0550  ? HDL 41 09/17/2018 0550  ? CHOLHDL 4.0 09/17/2018 0550  ? VLDL 28 09/17/2018 0550  ? [place] 96 09/17/2018 0550  ? ? ? ?Risk Assessment/Calculations:   ? ? ?    ? ?   ? ?Physical Exam:   ? ?VS:  BP 130/80 (BP Location: Left Arm, Patient Position: Sitting, Cuff Size: Normal)   Pulse (!) 51   Ht 5\' 9"  (1.753 m)   Wt 256 lb (116.1 kg)   SpO2 95%   BMI 37.80 kg/m?    ? ?Wt Readings from Last 3 Encounters:  ?04/17/22 256 lb (116.1 kg)  ?04/13/21 (!) 304 lb 0.2 oz (137.9 kg)  ?04/01/21 (!) 304 lb (137.9 kg)  ?  ? ?GEN:  Well nourished, well developed in no acute distress ?HEENT: Normal ?NECK: No JVD; No carotid bruits ?LYMPHATICS: No lymphadenopathy ?CARDIAC: RRR, no murmurs, no rubs, gallops ?RESPIRATORY:  Clear to auscultation without rales, wheezing or rhonchi  ?ABDOMEN: Soft, non-tender, non-distended ?MUSCULOSKELETAL:  No edema; No deformity  ?SKIN: Warm and dry ?NEUROLOGIC:  Alert and oriented x 3 ?PSYCHIATRIC:  Normal affect  ? ?ASSESSMENT:   ? ?1. Chronic systolic heart failure ([place])   ?2. Other cardiomyopathy ([place])   ?3. Typical atrial flutter ([place])   ?4. Pure hypercholesterolemia   ?5. OSA (obstructive sleep apnea)   ?6. Morbid obesity ([place])   ? ?PLAN:   ? ?In order of problems listed above: ? ?Chronic systolic heart failure ([place]) ?Overall doing well with NYHA class I symptoms.  Ejection fraction originally was 35%.  On most recent check 65%.  He had relative hypotension in the past which restricted goal-directed medical therapy.  It was thought that his systolic heart failure could have been tachycardia induced.  He has had a atrial flutter ablation in the past.  Continuing with lisinopril 5 mg once a day and Toprol 25 mg daily. ? ?Cardiomyopathy ([place]) ?Felt to be possibly tachycardia mediated or strictly nonischemic. ? ?Atrial flutter ([place]) ?Atrial flutter ablation in 2018 by Dr. Curt Bears successful. ? ?Hyperlipidemia ?On atorvastatin 40 mg.  Prior LDL 119.   Hemoglobin A1c 6. ? ?OSA (obstructive sleep apnea) ?Stopped wearing CPAP.  Since weight loss has stopped wearing it.  States that he feels rested.  He will be seeing sleep clinic follow-up soon to discuss. ? ?Morbid obesit

## 2022-04-23 ENCOUNTER — Other Ambulatory Visit: Payer: Self-pay | Admitting: Cardiology

## 2022-04-23 DIAGNOSIS — I5022 Chronic systolic (congestive) heart failure: Secondary | ICD-10-CM

## 2022-08-23 DIAGNOSIS — H25813 Combined forms of age-related cataract, bilateral: Secondary | ICD-10-CM | POA: Diagnosis not present

## 2022-08-23 DIAGNOSIS — H35363 Drusen (degenerative) of macula, bilateral: Secondary | ICD-10-CM | POA: Diagnosis not present

## 2022-08-23 DIAGNOSIS — H35453 Secondary pigmentary degeneration, bilateral: Secondary | ICD-10-CM | POA: Diagnosis not present

## 2022-08-23 DIAGNOSIS — H353131 Nonexudative age-related macular degeneration, bilateral, early dry stage: Secondary | ICD-10-CM | POA: Diagnosis not present

## 2022-08-23 DIAGNOSIS — H31092 Other chorioretinal scars, left eye: Secondary | ICD-10-CM | POA: Diagnosis not present

## 2023-02-15 DIAGNOSIS — E782 Mixed hyperlipidemia: Secondary | ICD-10-CM | POA: Diagnosis not present

## 2023-02-15 DIAGNOSIS — N529 Male erectile dysfunction, unspecified: Secondary | ICD-10-CM | POA: Diagnosis not present

## 2023-02-15 DIAGNOSIS — I5022 Chronic systolic (congestive) heart failure: Secondary | ICD-10-CM | POA: Diagnosis not present

## 2023-02-15 DIAGNOSIS — I429 Cardiomyopathy, unspecified: Secondary | ICD-10-CM | POA: Diagnosis not present

## 2023-02-15 DIAGNOSIS — E669 Obesity, unspecified: Secondary | ICD-10-CM | POA: Diagnosis not present

## 2023-02-15 DIAGNOSIS — Z1159 Encounter for screening for other viral diseases: Secondary | ICD-10-CM | POA: Diagnosis not present

## 2023-02-15 DIAGNOSIS — Z0000 Encounter for general adult medical examination without abnormal findings: Secondary | ICD-10-CM | POA: Diagnosis not present

## 2023-02-15 DIAGNOSIS — G4733 Obstructive sleep apnea (adult) (pediatric): Secondary | ICD-10-CM | POA: Diagnosis not present

## 2023-02-15 DIAGNOSIS — Z125 Encounter for screening for malignant neoplasm of prostate: Secondary | ICD-10-CM | POA: Diagnosis not present

## 2023-02-15 DIAGNOSIS — Z23 Encounter for immunization: Secondary | ICD-10-CM | POA: Diagnosis not present

## 2023-02-15 DIAGNOSIS — Z1331 Encounter for screening for depression: Secondary | ICD-10-CM | POA: Diagnosis not present

## 2023-03-20 DIAGNOSIS — R972 Elevated prostate specific antigen [PSA]: Secondary | ICD-10-CM | POA: Diagnosis not present

## 2023-04-19 DIAGNOSIS — N400 Enlarged prostate without lower urinary tract symptoms: Secondary | ICD-10-CM | POA: Diagnosis not present

## 2023-04-19 DIAGNOSIS — R972 Elevated prostate specific antigen [PSA]: Secondary | ICD-10-CM | POA: Diagnosis not present

## 2023-04-20 ENCOUNTER — Encounter (HOSPITAL_COMMUNITY): Payer: Self-pay | Admitting: Urology

## 2023-04-25 ENCOUNTER — Other Ambulatory Visit (HOSPITAL_COMMUNITY): Payer: Self-pay | Admitting: Urology

## 2023-04-25 DIAGNOSIS — R972 Elevated prostate specific antigen [PSA]: Secondary | ICD-10-CM

## 2023-04-27 ENCOUNTER — Other Ambulatory Visit: Payer: Self-pay | Admitting: Cardiology

## 2023-04-27 DIAGNOSIS — I5022 Chronic systolic (congestive) heart failure: Secondary | ICD-10-CM

## 2023-05-07 ENCOUNTER — Ambulatory Visit (HOSPITAL_COMMUNITY)
Admission: RE | Admit: 2023-05-07 | Discharge: 2023-05-07 | Disposition: A | Discharge status: Home / Self Care | Payer: PPO | Source: Ambulatory Visit | Attending: Urology | Admitting: Urology

## 2023-05-07 DIAGNOSIS — R972 Elevated prostate specific antigen [PSA]: Secondary | ICD-10-CM | POA: Diagnosis not present

## 2023-05-07 MED ORDER — GADOBUTROL 1 MMOL/ML IV SOLN
10.0000 mL | Freq: Once | INTRAVENOUS | Status: AC | PRN
  Administered 2023-05-07: 10 mL via INTRAVENOUS

## 2023-05-17 DIAGNOSIS — R3912 Poor urinary stream: Secondary | ICD-10-CM | POA: Diagnosis not present

## 2023-05-17 DIAGNOSIS — R3915 Urgency of urination: Secondary | ICD-10-CM | POA: Diagnosis not present

## 2023-05-17 DIAGNOSIS — R972 Elevated prostate specific antigen [PSA]: Secondary | ICD-10-CM | POA: Diagnosis not present

## 2023-05-17 DIAGNOSIS — N401 Enlarged prostate with lower urinary tract symptoms: Secondary | ICD-10-CM | POA: Diagnosis not present

## 2023-05-17 DIAGNOSIS — R3911 Hesitancy of micturition: Secondary | ICD-10-CM | POA: Diagnosis not present

## 2023-05-17 DIAGNOSIS — D400 Neoplasm of uncertain behavior of prostate: Secondary | ICD-10-CM | POA: Diagnosis not present

## 2023-06-21 DIAGNOSIS — R3911 Hesitancy of micturition: Secondary | ICD-10-CM | POA: Diagnosis not present

## 2023-06-21 DIAGNOSIS — R3912 Poor urinary stream: Secondary | ICD-10-CM | POA: Diagnosis not present

## 2023-06-21 DIAGNOSIS — R3915 Urgency of urination: Secondary | ICD-10-CM | POA: Diagnosis not present

## 2023-06-25 DIAGNOSIS — R972 Elevated prostate specific antigen [PSA]: Secondary | ICD-10-CM | POA: Diagnosis not present

## 2023-07-05 DIAGNOSIS — R3912 Poor urinary stream: Secondary | ICD-10-CM | POA: Diagnosis not present

## 2023-07-05 DIAGNOSIS — R3915 Urgency of urination: Secondary | ICD-10-CM | POA: Diagnosis not present

## 2023-07-05 DIAGNOSIS — R972 Elevated prostate specific antigen [PSA]: Secondary | ICD-10-CM | POA: Diagnosis not present

## 2023-07-05 DIAGNOSIS — N401 Enlarged prostate with lower urinary tract symptoms: Secondary | ICD-10-CM | POA: Diagnosis not present

## 2023-07-21 ENCOUNTER — Other Ambulatory Visit: Payer: Self-pay | Admitting: Cardiology

## 2023-07-21 DIAGNOSIS — I5022 Chronic systolic (congestive) heart failure: Secondary | ICD-10-CM

## 2023-08-15 DIAGNOSIS — E782 Mixed hyperlipidemia: Secondary | ICD-10-CM | POA: Diagnosis not present

## 2023-08-15 DIAGNOSIS — I428 Other cardiomyopathies: Secondary | ICD-10-CM | POA: Diagnosis not present

## 2023-08-15 DIAGNOSIS — I5022 Chronic systolic (congestive) heart failure: Secondary | ICD-10-CM | POA: Diagnosis not present

## 2023-08-15 DIAGNOSIS — E669 Obesity, unspecified: Secondary | ICD-10-CM | POA: Diagnosis not present

## 2023-08-15 DIAGNOSIS — N529 Male erectile dysfunction, unspecified: Secondary | ICD-10-CM | POA: Diagnosis not present

## 2023-08-15 DIAGNOSIS — Z6832 Body mass index (BMI) 32.0-32.9, adult: Secondary | ICD-10-CM | POA: Diagnosis not present

## 2023-09-05 ENCOUNTER — Encounter: Payer: Self-pay | Admitting: Cardiology

## 2023-09-05 ENCOUNTER — Ambulatory Visit: Payer: PPO | Attending: Cardiology | Admitting: Cardiology

## 2023-09-05 VITALS — BP 106/68 | HR 97 | Ht 69.0 in | Wt 213.0 lb

## 2023-09-05 DIAGNOSIS — E7800 Pure hypercholesterolemia, unspecified: Secondary | ICD-10-CM

## 2023-09-05 DIAGNOSIS — I483 Typical atrial flutter: Secondary | ICD-10-CM

## 2023-09-05 DIAGNOSIS — I5022 Chronic systolic (congestive) heart failure: Secondary | ICD-10-CM | POA: Diagnosis not present

## 2023-09-05 NOTE — Patient Instructions (Signed)
Medication Instructions:  Your physician recommends that you continue on your current medications as directed. Please refer to the Current Medication list given to you today.  *If you need a refill on your cardiac medications before your next appointment, please call your pharmacy*  Lab Work: None ordered today  Testing/Procedures: None ordered today  Follow-Up: At [facility], you and your health needs are our priority.  As part of our continuing mission to provide you with exceptional heart care, we have created designated Provider Care Teams.  These Care Teams include your primary Cardiologist (physician) and Advanced Practice Providers (APPs -  Physician Assistants and Nurse Practitioners) who all work together to provide you with the care you need, when you need it.  Your next appointment:   12 month(s)  The format for your next appointment:   In Person  Provider:   [name], MD

## 2023-09-05 NOTE — Progress Notes (Signed)
  Cardiology Office Note:  .   Date:  09/05/2023  ID:  Jerome Kent, DOB 1951-10-22, MRN [id number] PCP: Merri Brunette, MD  [facility] Providers Cardiologist:  [name], MD    History of Present Illness: [name] Kitchen   Jerome Kent is a 72 y.o. male Discussed the use of AI scribe software for clinical note transcription with the patient, who gave verbal consent to proceed.  AFLUTTER ABLATION 2018 [place]Z  History of Present Illness   The patient, a 72 year old with a history of systolic heart failure, atrial flutter, and sleep apnea, presents for a routine follow-up. He reports significant weight loss of 100 pounds over the past year, largely attributed to dietary changes and stress. The patient has been under considerable stress due to caregiving responsibilities for their live-in partner who has developed Wernicke's dementia secondary to alcoholism. The partner's condition has been progressively worsening, causing significant strain on the patient's mental and physical health. The patient has been managing the caregiving responsibilities single-handedly, with minimal support from the partner's family.  The patient's heart failure and atrial flutter have been well-managed with low doses of lisinopril and metoprolol. He has also been on atorvastatin for cholesterol management. The patient reports that he has stopped using CPAP for sleep apnea due to significant weight loss.        Studies Reviewed: [name] Kitchen   EKG Interpretation Date/Time:  Wednesday September 05 2023 16:02:58 EDT Ventricular Rate:  48 PR Interval:  198 QRS Duration:  132 QT Interval:  466 QTC Calculation: 416 R Axis:   -20  Text Interpretation: Sinus bradycardia with sinus arrhythmia Non-specific intra-ventricular conduction block When compared with ECG of 17-Sep-2018 10:03, Sinus rhythm has replaced Atrial fibrillation Vent. rate has decreased BY  43 BPM QRS duration has increased QT has shortened Confirmed by  [name] ([id number]) on 09/05/2023 4:07:40 PM    LABS LDL: 88 (01/2023) Hb: 14.4 g/dL Cr: 0.9 mg/dL Risk Assessment/Calculations:            Physical Exam:   VS:  BP 106/68   Pulse 97   Ht 5\' 9"  (1.753 m)   Wt 213 lb (96.6 kg)   SpO2 97%   BMI 31.45 kg/m    Wt Readings from Last 3 Encounters:  09/05/23 213 lb (96.6 kg)  04/17/22 256 lb (116.1 kg)  04/13/21 (!) 304 lb 0.2 oz (137.9 kg)    GEN: Well nourished, well developed in no acute distress NECK: No JVD; No carotid bruits CARDIAC: RRR, no murmurs, rubs, gallops RESPIRATORY:  Clear to auscultation without rales, wheezing or rhonchi  ABDOMEN: Soft, non-tender, non-distended EXTREMITIES:  No edema; No deformity   ASSESSMENT AND PLAN: .    Assessment and Plan    Chronic Systolic Heart Failure Ejection fraction improved from 35% to 65%. Currently on low doses of Lisinopril 5mg  daily and Metoprolol 25mg  daily due to history of lower blood pressure. -Continue current medication regimen. NYHA 1  Hyperlipidemia On Atorvastatin 40mg  daily with LDL of 88 in February 2024. -Continue Atorvastatin 40mg  daily.  Obstructive Sleep Apnea No longer using CPAP due to significant weight loss. -No changes to current management.  Social Stressors Patient is experiencing significant stress due to caregiving responsibilities for a partner with Wernicke's dementia. -Refer to Child psychotherapist for potential resources and support.  Follow-up in 1 year.             Signed, [name], MD

## 2023-09-06 ENCOUNTER — Telehealth (HOSPITAL_COMMUNITY): Payer: Self-pay | Admitting: Licensed Clinical Social Worker

## 2023-09-06 NOTE — Telephone Encounter (Signed)
H&V Care Navigation CSW Progress Note  Clinical Social Worker consulted to reach out to pt regarding stressors related to being a caregiver of his girlfriend with dementia.  Pt reports that has been dating his girlfriend for two years and that has since been diagnosed with dementia.  They had moved in together so now he has become the primary caregiver.  She has two daughters who live locally and a son who lives in [place].  The daughters are now the guardians and payees but they are not willing or able to take the patient in and at this point have not been providing financial compensation to the patient for his part in her care or the things he purchases for her (food, medications, depends).   States the goal is to get the patient into an ALF or SNF but he isn't sure what is happening with this process- thinks she is on a waitlist.  CSW discussed possible assistance options with patient:  -option to get PCS services for the girlfriend but patient reports that she did not respond well to having different people in the home- states that she can complete ADLs independently and he makes sure she has food- has cameras up throughout the house for safety and nothing concerning has happened at this time  -suggested daughters helping the girlfriend apply for food stamps as she only gets $940 from disability  Overall this situation is very complicated and there are not any concrete things that we can assist with.  Patient just wanting guidance on how to proceed as he does not want to be her caregiver long term.  Suggested he speak with a lawyer regarding his rights in this matter and the daughters legal responsibility for her care. Discussed potentially him creating an APS case if the daughters are not willing to pay for her care in some way as they are in charge of her money.  Offered the patient resources for caregiver support as he is struggling greatly with this burden but he is not interested at this  time.  CSW provided pt with my contact information and encouraged him to reach out if he wanted or needed to discuss things further.  At this time he will plan to reach out to a lawyer to understand the appropriate next steps.   SDOH Screenings   Tobacco Use: Medium Risk (09/05/2023)    Burna Sis, LCSW Clinical Social Worker [facility] Desk#: [phone] Cell#: [phone]

## 2023-10-15 DIAGNOSIS — H353131 Nonexudative age-related macular degeneration, bilateral, early dry stage: Secondary | ICD-10-CM | POA: Diagnosis not present

## 2023-10-15 DIAGNOSIS — H35363 Drusen (degenerative) of macula, bilateral: Secondary | ICD-10-CM | POA: Diagnosis not present

## 2023-10-15 DIAGNOSIS — H25813 Combined forms of age-related cataract, bilateral: Secondary | ICD-10-CM | POA: Diagnosis not present

## 2023-10-15 DIAGNOSIS — H31092 Other chorioretinal scars, left eye: Secondary | ICD-10-CM | POA: Diagnosis not present

## 2023-10-15 DIAGNOSIS — H35453 Secondary pigmentary degeneration, bilateral: Secondary | ICD-10-CM | POA: Diagnosis not present

## 2023-11-01 ENCOUNTER — Telehealth: Payer: Self-pay | Admitting: Cardiology

## 2023-11-01 DIAGNOSIS — I5022 Chronic systolic (congestive) heart failure: Secondary | ICD-10-CM

## 2023-11-01 NOTE — Telephone Encounter (Signed)
*  STAT* If patient is at the pharmacy, call can be transferred to refill team.   1. Which medications need to be refilled? (please list name of each medication and dose if known)   atorvastatin (LIPITOR) 40 MG tablet  furosemide (LASIX) 20 MG tablet  lisinopril (ZESTRIL) 5 MG tablet  metoprolol succinate (TOPROL-XL) 25 MG 24 hr tablet    2. Would you like to learn more about the convenience, safety, & potential cost savings by using the [facility]?   3. Are you open to using the [facility] (Type [facility]. ).  4. Which pharmacy/location (including street and city if local pharmacy) is medication to be sent to?  [facility] - [place], [place] - [address]   5. Do they need a 30 day or 90 day supply?   90 day  Patient stated he still has some of these medications.  Patient noted he has changed to [facility].

## 2023-11-02 MED ORDER — ATORVASTATIN CALCIUM 40 MG PO TABS: 40.0000 mg | ORAL_TABLET | Freq: Every day | ORAL | 3 refills | Status: AC

## 2023-11-02 MED ORDER — FUROSEMIDE 20 MG PO TABS: 20.0000 mg | ORAL_TABLET | Freq: Every morning | ORAL | 3 refills | Status: AC

## 2023-11-02 MED ORDER — LISINOPRIL 5 MG PO TABS: 5.0000 mg | ORAL_TABLET | Freq: Every morning | ORAL | 3 refills | Status: AC

## 2023-11-02 MED ORDER — METOPROLOL SUCCINATE ER 25 MG PO TB24: 25.0000 mg | ORAL_TABLET | Freq: Every morning | ORAL | 3 refills | Status: AC

## 2023-11-02 NOTE — Telephone Encounter (Signed)
Pt's medications were sent to pt's pharmacy as requested. Confirmation received.  

## 2024-03-06 DIAGNOSIS — Z125 Encounter for screening for malignant neoplasm of prostate: Secondary | ICD-10-CM | POA: Diagnosis not present

## 2024-03-06 DIAGNOSIS — N529 Male erectile dysfunction, unspecified: Secondary | ICD-10-CM | POA: Diagnosis not present

## 2024-03-06 DIAGNOSIS — E782 Mixed hyperlipidemia: Secondary | ICD-10-CM | POA: Diagnosis not present

## 2024-03-06 DIAGNOSIS — G4733 Obstructive sleep apnea (adult) (pediatric): Secondary | ICD-10-CM | POA: Diagnosis not present

## 2024-03-06 DIAGNOSIS — Z0000 Encounter for general adult medical examination without abnormal findings: Secondary | ICD-10-CM | POA: Diagnosis not present

## 2024-03-06 DIAGNOSIS — Z1331 Encounter for screening for depression: Secondary | ICD-10-CM | POA: Diagnosis not present

## 2024-03-06 DIAGNOSIS — I5022 Chronic systolic (congestive) heart failure: Secondary | ICD-10-CM | POA: Diagnosis not present

## 2024-03-06 DIAGNOSIS — E669 Obesity, unspecified: Secondary | ICD-10-CM | POA: Diagnosis not present

## 2024-03-06 DIAGNOSIS — Z23 Encounter for immunization: Secondary | ICD-10-CM | POA: Diagnosis not present

## 2024-04-24 DIAGNOSIS — M9903 Segmental and somatic dysfunction of lumbar region: Secondary | ICD-10-CM | POA: Diagnosis not present

## 2024-04-24 DIAGNOSIS — M5136 Other intervertebral disc degeneration, lumbar region with discogenic back pain only: Secondary | ICD-10-CM | POA: Diagnosis not present

## 2024-04-24 DIAGNOSIS — M9902 Segmental and somatic dysfunction of thoracic region: Secondary | ICD-10-CM | POA: Diagnosis not present

## 2024-04-24 DIAGNOSIS — M5137 Other intervertebral disc degeneration, lumbosacral region with discogenic back pain only: Secondary | ICD-10-CM | POA: Diagnosis not present

## 2024-04-24 DIAGNOSIS — M9904 Segmental and somatic dysfunction of sacral region: Secondary | ICD-10-CM | POA: Diagnosis not present

## 2024-04-30 DIAGNOSIS — M9903 Segmental and somatic dysfunction of lumbar region: Secondary | ICD-10-CM | POA: Diagnosis not present

## 2024-04-30 DIAGNOSIS — M5136 Other intervertebral disc degeneration, lumbar region with discogenic back pain only: Secondary | ICD-10-CM | POA: Diagnosis not present

## 2024-04-30 DIAGNOSIS — M9902 Segmental and somatic dysfunction of thoracic region: Secondary | ICD-10-CM | POA: Diagnosis not present

## 2024-04-30 DIAGNOSIS — M9904 Segmental and somatic dysfunction of sacral region: Secondary | ICD-10-CM | POA: Diagnosis not present

## 2024-04-30 DIAGNOSIS — M5137 Other intervertebral disc degeneration, lumbosacral region with discogenic back pain only: Secondary | ICD-10-CM | POA: Diagnosis not present

## 2024-05-02 DIAGNOSIS — M9903 Segmental and somatic dysfunction of lumbar region: Secondary | ICD-10-CM | POA: Diagnosis not present

## 2024-05-02 DIAGNOSIS — M9902 Segmental and somatic dysfunction of thoracic region: Secondary | ICD-10-CM | POA: Diagnosis not present

## 2024-05-02 DIAGNOSIS — M9904 Segmental and somatic dysfunction of sacral region: Secondary | ICD-10-CM | POA: Diagnosis not present

## 2024-05-02 DIAGNOSIS — M5136 Other intervertebral disc degeneration, lumbar region with discogenic back pain only: Secondary | ICD-10-CM | POA: Diagnosis not present

## 2024-05-02 DIAGNOSIS — M5137 Other intervertebral disc degeneration, lumbosacral region with discogenic back pain only: Secondary | ICD-10-CM | POA: Diagnosis not present

## 2024-05-07 DIAGNOSIS — M5137 Other intervertebral disc degeneration, lumbosacral region with discogenic back pain only: Secondary | ICD-10-CM | POA: Diagnosis not present

## 2024-05-07 DIAGNOSIS — M9902 Segmental and somatic dysfunction of thoracic region: Secondary | ICD-10-CM | POA: Diagnosis not present

## 2024-05-07 DIAGNOSIS — M9903 Segmental and somatic dysfunction of lumbar region: Secondary | ICD-10-CM | POA: Diagnosis not present

## 2024-05-07 DIAGNOSIS — M5136 Other intervertebral disc degeneration, lumbar region with discogenic back pain only: Secondary | ICD-10-CM | POA: Diagnosis not present

## 2024-05-07 DIAGNOSIS — M9904 Segmental and somatic dysfunction of sacral region: Secondary | ICD-10-CM | POA: Diagnosis not present

## 2024-09-11 DIAGNOSIS — I5022 Chronic systolic (congestive) heart failure: Secondary | ICD-10-CM | POA: Diagnosis not present

## 2024-09-11 DIAGNOSIS — Z833 Family history of diabetes mellitus: Secondary | ICD-10-CM | POA: Diagnosis not present

## 2024-09-11 DIAGNOSIS — Z23 Encounter for immunization: Secondary | ICD-10-CM | POA: Diagnosis not present

## 2024-09-11 DIAGNOSIS — I429 Cardiomyopathy, unspecified: Secondary | ICD-10-CM | POA: Diagnosis not present

## 2024-09-11 DIAGNOSIS — N529 Male erectile dysfunction, unspecified: Secondary | ICD-10-CM | POA: Diagnosis not present

## 2024-09-11 DIAGNOSIS — E782 Mixed hyperlipidemia: Secondary | ICD-10-CM | POA: Diagnosis not present

## 2024-10-14 DIAGNOSIS — H43812 Vitreous degeneration, left eye: Secondary | ICD-10-CM | POA: Diagnosis not present

## 2024-10-14 DIAGNOSIS — Z961 Presence of intraocular lens: Secondary | ICD-10-CM | POA: Diagnosis not present

## 2024-10-21 ENCOUNTER — Encounter: Payer: Self-pay | Admitting: Cardiology

## 2024-10-21 ENCOUNTER — Ambulatory Visit: Attending: Cardiology | Admitting: Cardiology

## 2024-10-21 VITALS — BP 118/77 | HR 55 | Ht 69.0 in | Wt 248.0 lb

## 2024-10-21 DIAGNOSIS — I5022 Chronic systolic (congestive) heart failure: Secondary | ICD-10-CM | POA: Diagnosis not present

## 2024-10-21 DIAGNOSIS — I428 Other cardiomyopathies: Secondary | ICD-10-CM | POA: Diagnosis not present

## 2024-10-21 DIAGNOSIS — I483 Typical atrial flutter: Secondary | ICD-10-CM | POA: Diagnosis not present

## 2024-10-21 DIAGNOSIS — E7800 Pure hypercholesterolemia, unspecified: Secondary | ICD-10-CM

## 2024-10-21 NOTE — Patient Instructions (Signed)

## 2024-10-21 NOTE — Progress Notes (Signed)
 Cardiology Office Note:  .   Date:  10/21/2024  ID:  Jerome Kent, DOB August 22, 1951, MRN [id number] PCP: Claudene Pellet, MD  [facility] Providers Cardiologist:  Oneil Parchment, MD    History of Present Illness: Jerome   NASSIM Kent is a 73 y.o. male Discussed the use of AI scribe software for clinical note transcription with the patient, who gave verbal consent to proceed.  History of Present Illness Jerome Kent is a 73 year old male with chronic systolic heart failure who presents for follow-up.  He has chronic systolic heart failure with an ejection fraction improved from 35% to 65%. He is on lisinopril  5 mg daily and metoprolol  25 mg daily, with a history of hypotension. No side effects from his medications are reported.  He has a history of atrial flutter and underwent ablation in 2018. He is no longer on low-dose aspirin  after changing healthcare providers.  He has experienced significant weight loss of up to 100 pounds, leading to the discontinuation of CPAP use for previously diagnosed obstructive sleep apnea. Recently, he mentions some weight gain due to stress and changes in employment.  He is under stress from caring for his live-in partner who has Wernicke's dementia secondary to alcoholism. He was laid off from his job and is now working part-time at [employer].  Recent lab results include a creatinine of 0.9, hemoglobin of 14.4, and LDL of 88. He is on atorvastatin  40 mg daily for cholesterol management, with an LDL of 79 noted in March 2025. His A1c is 5.5, indicating good control of prediabetes.  He feels he is getting around well. Works at [employer] 3 days a week. More strenuous than prior job.       ROS: No CP, NO SOB  Studies Reviewed: Jerome   EKG Interpretation Date/Time:  Tuesday October 21 2024 08:30:31 EDT Ventricular Rate:  55 PR Interval:  204 QRS Duration:  136 QT Interval:  448 QTC Calculation: 428 R  Axis:   -53  Text Interpretation: Sinus bradycardia Left axis deviation Non-specific intra-ventricular conduction block Minimal voltage criteria for LVH, may be normal variant ( Cornell product ) When compared with ECG of 05-Sep-2023 16:02, No significant change since last tracing Confirmed by Parchment Oneil ([id number]) on 10/21/2024 8:40:42 AM    Results LABS Creatinine: 0.9 Hemoglobin: 14.4 LDL: 88 (01/2023) LDL: 79 (02/2024) A1c: 5.5  DIAGNOSTIC Ejection fraction: 65% EKG: Normal (10/21/2024) Risk Assessment/Calculations:            Physical Exam:   VS:  BP 118/77   Pulse (!) 55   Ht 5' 9 (1.753 m)   Wt 248 lb (112.5 kg)   SpO2 96%   BMI 36.62 kg/m    Wt Readings from Last 3 Encounters:  10/21/24 248 lb (112.5 kg)  09/05/23 213 lb (96.6 kg)  04/17/22 256 lb (116.1 kg)    GEN: Well nourished, well developed in no acute distress NECK: No JVD; No carotid bruits CARDIAC: RRR, no murmurs, no rubs, no gallops RESPIRATORY:  Clear to auscultation without rales, wheezing or rhonchi  ABDOMEN: Soft, non-tender, non-distended EXTREMITIES:  No edema; No deformity   ASSESSMENT AND PLAN: .    Assessment and Plan Assessment & Plan Chronic systolic heart failure with improved ejection fraction Chronic systolic heart failure with significant improvement in ejection fraction from 35% to 65%. Continues to be NYHA class 1. Currently on low doses of lisinopril  and metoprolol  due to hypotension. -  Continue lisinopril  5 mg oral daily - Continue metoprolol  25 mg oral daily  Atrial flutter status post ablation Atrial flutter status post successful ablation in 2018.  Hyperlipidemia Hyperlipidemia well-controlled with atorvastatin . LDL level was 79 in March 7974. No side effects reported from atorvastatin . - Continue atorvastatin  40 mg oral daily  Obstructive sleep apnea, resolved with weight loss Obstructive sleep apnea resolved with significant weight loss. No longer using CPAP.  Social  stressors related to caregiving and employment Experiencing social stressors related to caregiving for partner with Wernicke's dementia and recent employment changes. Currently working part-time. Stress management and maintaining physical activity are important. - Encourage continued physical activity and stress management         Dispo: 1 yr  Signed, Oneil Parchment, MD

## 2024-12-13 ENCOUNTER — Emergency Department (HOSPITAL_COMMUNITY)
Admission: EM | Admit: 2024-12-13 | Discharge: 2024-12-13 | Discharge status: Against Medical Advice | Attending: Emergency Medicine | Admitting: Emergency Medicine

## 2024-12-13 DIAGNOSIS — Z5321 Procedure and treatment not carried out due to patient leaving prior to being seen by health care provider: Secondary | ICD-10-CM | POA: Diagnosis present

## 2024-12-13 NOTE — ED Triage Notes (Signed)
 Pt arrived via POV for catheter leakage.  Pt A&O x 4 and NAD.

## 2024-12-13 NOTE — ED Notes (Addendum)
 Changed leg bag. Educated pt on how to empty

## 2024-12-13 NOTE — ED Notes (Signed)
 Pt stated he did not want to wait to be seen by a provider due to extended wait times
